# Patient Record
Sex: Male | Born: 1935
Health system: Southern US, Community
[De-identification: ages and names within clinical notes are randomized; demographics above are authoritative.]

## PROBLEM LIST (undated history)

## (undated) DIAGNOSIS — W108XXA Fall (on) (from) other stairs and steps, initial encounter: Secondary | ICD-10-CM

## (undated) DIAGNOSIS — K219 Gastro-esophageal reflux disease without esophagitis: Secondary | ICD-10-CM

## (undated) DIAGNOSIS — I10 Essential (primary) hypertension: Secondary | ICD-10-CM

## (undated) DIAGNOSIS — C4492 Squamous cell carcinoma of skin, unspecified: Secondary | ICD-10-CM

## (undated) DIAGNOSIS — C4491 Basal cell carcinoma of skin, unspecified: Secondary | ICD-10-CM

## (undated) DIAGNOSIS — M1990 Unspecified osteoarthritis, unspecified site: Secondary | ICD-10-CM

## (undated) DIAGNOSIS — C4372 Malignant melanoma of left lower limb, including hip: Secondary | ICD-10-CM

## (undated) HISTORY — DX: Fall (on) (from) other stairs and steps, initial encounter: W10.8XXA

## (undated) HISTORY — DX: Malignant melanoma of left lower limb, including hip: C43.72

## (undated) HISTORY — DX: Essential (primary) hypertension: I10

## (undated) HISTORY — DX: Gastro-esophageal reflux disease without esophagitis: K21.9

## (undated) HISTORY — PX: LEG SURGERY: SHX1003

## (undated) HISTORY — DX: Unspecified osteoarthritis, unspecified site: M19.90

---
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
## 1999-05-29 ENCOUNTER — Encounter: Payer: Self-pay | Admitting: Internal Medicine

## 1999-05-29 ENCOUNTER — Encounter: Admission: RE | Admit: 1999-05-29 | Discharge: 1999-05-29 | Payer: Self-pay | Admitting: Internal Medicine

## 2005-05-20 DIAGNOSIS — C4491 Basal cell carcinoma of skin, unspecified: Secondary | ICD-10-CM

## 2005-05-20 HISTORY — DX: Basal cell carcinoma of skin, unspecified: C44.91

## 2008-09-19 ENCOUNTER — Encounter: Admission: RE | Admit: 2008-09-19 | Discharge: 2008-09-19 | Payer: Self-pay | Admitting: Internal Medicine

## 2009-09-17 ENCOUNTER — Ambulatory Visit: Payer: Self-pay | Admitting: Cardiology

## 2009-09-17 ENCOUNTER — Inpatient Hospital Stay (HOSPITAL_COMMUNITY): Admission: EM | Admit: 2009-09-17 | Discharge: 2009-09-22 | Payer: Self-pay | Admitting: Emergency Medicine

## 2009-09-21 ENCOUNTER — Ambulatory Visit: Payer: Self-pay | Admitting: Surgery

## 2009-09-21 HISTORY — PX: ABDOMINAL AORTIC ANEURYSM REPAIR: SUR1152

## 2009-10-02 ENCOUNTER — Ambulatory Visit: Payer: Self-pay | Admitting: Surgery

## 2009-11-30 ENCOUNTER — Encounter: Admission: RE | Admit: 2009-11-30 | Discharge: 2009-11-30 | Payer: Self-pay | Admitting: Internal Medicine

## 2009-12-25 ENCOUNTER — Ambulatory Visit: Payer: Self-pay | Admitting: Surgery

## 2010-07-16 ENCOUNTER — Ambulatory Visit: Payer: Self-pay | Admitting: Surgery

## 2010-07-16 ENCOUNTER — Encounter
Admission: RE | Admit: 2010-07-16 | Discharge: 2010-07-16 | Payer: Self-pay | Source: Home / Self Care | Attending: Surgery | Admitting: Surgery

## 2010-08-10 ENCOUNTER — Ambulatory Visit
Admission: RE | Admit: 2010-08-10 | Discharge: 2010-08-10 | Payer: Self-pay | Source: Home / Self Care | Attending: Vascular Surgery | Admitting: Vascular Surgery

## 2010-08-26 ENCOUNTER — Encounter: Payer: Self-pay | Admitting: Surgery

## 2010-10-15 ENCOUNTER — Encounter (INDEPENDENT_AMBULATORY_CARE_PROVIDER_SITE_OTHER): Payer: Medicare Other

## 2010-10-15 DIAGNOSIS — I714 Abdominal aortic aneurysm, without rupture, unspecified: Secondary | ICD-10-CM

## 2010-10-15 DIAGNOSIS — Z48812 Encounter for surgical aftercare following surgery on the circulatory system: Secondary | ICD-10-CM

## 2010-10-25 LAB — CBC
HCT: 37.2 % — ABNORMAL LOW (ref 39.0–52.0)
HCT: 40.8 % (ref 39.0–52.0)
HCT: 42.3 % (ref 39.0–52.0)
HCT: 43 % (ref 39.0–52.0)
Hemoglobin: 13.2 g/dL (ref 13.0–17.0)
Hemoglobin: 14.3 g/dL (ref 13.0–17.0)
Hemoglobin: 14.8 g/dL (ref 13.0–17.0)
Hemoglobin: 15 g/dL (ref 13.0–17.0)
MCHC: 35 g/dL (ref 30.0–36.0)
MCHC: 35 g/dL (ref 30.0–36.0)
MCHC: 35.1 g/dL (ref 30.0–36.0)
MCHC: 35.5 g/dL (ref 30.0–36.0)
MCV: 93 fL (ref 78.0–100.0)
MCV: 93 fL (ref 78.0–100.0)
MCV: 93.3 fL (ref 78.0–100.0)
MCV: 93.4 fL (ref 78.0–100.0)
Platelets: 120 K/uL — ABNORMAL LOW (ref 150–400)
Platelets: 128 10*3/uL — ABNORMAL LOW (ref 150–400)
Platelets: 139 K/uL — ABNORMAL LOW (ref 150–400)
Platelets: 150 K/uL (ref 150–400)
RBC: 3.99 MIL/uL — ABNORMAL LOW (ref 4.22–5.81)
RBC: 4.39 MIL/uL (ref 4.22–5.81)
RBC: 4.52 MIL/uL (ref 4.22–5.81)
RBC: 4.61 MIL/uL (ref 4.22–5.81)
RDW: 13.1 % (ref 11.5–15.5)
RDW: 13.2 % (ref 11.5–15.5)
RDW: 13.2 % (ref 11.5–15.5)
RDW: 13.3 % (ref 11.5–15.5)
WBC: 6.4 10*3/uL (ref 4.0–10.5)
WBC: 6.6 10*3/uL (ref 4.0–10.5)
WBC: 7.7 10*3/uL (ref 4.0–10.5)
WBC: 8.4 K/uL (ref 4.0–10.5)

## 2010-10-25 LAB — CROSSMATCH
ABO/RH(D): O POS
Antibody Screen: NEGATIVE

## 2010-10-25 LAB — COMPREHENSIVE METABOLIC PANEL
ALT: 28 U/L (ref 0–53)
AST: 24 U/L (ref 0–37)
Alkaline Phosphatase: 47 U/L (ref 39–117)
CO2: 24 mEq/L (ref 19–32)
Calcium: 8.7 mg/dL (ref 8.4–10.5)
Chloride: 89 mEq/L — ABNORMAL LOW (ref 96–112)
GFR calc Af Amer: >60 mL/min (ref >60)
GFR calc non Af Amer: >60 mL/min (ref >60)
Potassium: 3.3 mEq/L — ABNORMAL LOW (ref 3.5–5.1)
Sodium: 126 mEq/L — ABNORMAL LOW (ref 135–145)

## 2010-10-25 LAB — COMPREHENSIVE METABOLIC PANEL WITH GFR
Albumin: 3.7 g/dL (ref 3.5–5.2)
BUN: 18 mg/dL (ref 6–23)
Creatinine, Ser: 0.78 mg/dL (ref 0.4–1.5)
Glucose, Bld: 135 mg/dL — ABNORMAL HIGH (ref 70–99)
Total Bilirubin: 0.7 mg/dL (ref 0.3–1.2)
Total Protein: 6.6 g/dL (ref 6.0–8.3)

## 2010-10-25 LAB — GLUCOSE, CAPILLARY
Glucose-Capillary: 107 mg/dL — ABNORMAL HIGH (ref 70–99)
Glucose-Capillary: 108 mg/dL — ABNORMAL HIGH (ref 70–99)
Glucose-Capillary: 112 mg/dL — ABNORMAL HIGH (ref 70–99)
Glucose-Capillary: 113 mg/dL — ABNORMAL HIGH (ref 70–99)
Glucose-Capillary: 118 mg/dL — ABNORMAL HIGH (ref 70–99)
Glucose-Capillary: 119 mg/dL — ABNORMAL HIGH (ref 70–99)
Glucose-Capillary: 120 mg/dL — ABNORMAL HIGH (ref 70–99)
Glucose-Capillary: 121 mg/dL — ABNORMAL HIGH (ref 70–99)
Glucose-Capillary: 125 mg/dL — ABNORMAL HIGH (ref 70–99)
Glucose-Capillary: 125 mg/dL — ABNORMAL HIGH (ref 70–99)
Glucose-Capillary: 129 mg/dL — ABNORMAL HIGH (ref 70–99)
Glucose-Capillary: 133 mg/dL — ABNORMAL HIGH (ref 70–99)
Glucose-Capillary: 138 mg/dL — ABNORMAL HIGH (ref 70–99)
Glucose-Capillary: 140 mg/dL — ABNORMAL HIGH (ref 70–99)
Glucose-Capillary: 145 mg/dL — ABNORMAL HIGH (ref 70–99)
Glucose-Capillary: 146 mg/dL — ABNORMAL HIGH (ref 70–99)
Glucose-Capillary: 152 mg/dL — ABNORMAL HIGH (ref 70–99)
Glucose-Capillary: 159 mg/dL — ABNORMAL HIGH (ref 70–99)
Glucose-Capillary: 87 mg/dL (ref 70–99)
Glucose-Capillary: 96 mg/dL (ref 70–99)

## 2010-10-25 LAB — BASIC METABOLIC PANEL
BUN: 16 mg/dL (ref 6–23)
CO2: 22 mEq/L (ref 19–32)
CO2: 25 mEq/L (ref 19–32)
Calcium: 8.2 mg/dL — ABNORMAL LOW (ref 8.4–10.5)
Calcium: 8.5 mg/dL (ref 8.4–10.5)
Chloride: 94 mEq/L — ABNORMAL LOW (ref 96–112)
Chloride: 96 mEq/L (ref 96–112)
GFR calc Af Amer: >60 mL/min (ref >60)
GFR calc Af Amer: >60 mL/min (ref >60)
GFR calc Af Amer: >60 mL/min (ref >60)
GFR calc non Af Amer: >60 mL/min (ref >60)
GFR calc non Af Amer: >60 mL/min (ref >60)
GFR calc non Af Amer: >60 mL/min (ref >60)
Glucose, Bld: 133 mg/dL — ABNORMAL HIGH (ref 70–99)
Potassium: 3.4 mEq/L — ABNORMAL LOW (ref 3.5–5.1)
Sodium: 127 mEq/L — ABNORMAL LOW (ref 135–145)

## 2010-10-25 LAB — POCT I-STAT, CHEM 8
BUN: 18 mg/dL (ref 6–23)
Calcium, Ion: 0.89 mmol/L — ABNORMAL LOW (ref 1.12–1.32)
Chloride: 101 mEq/L (ref 96–112)
Creatinine, Ser: 0.8 mg/dL (ref 0.4–1.5)
Glucose, Bld: 100 mg/dL — ABNORMAL HIGH (ref 70–99)
HCT: 41 % (ref 39.0–52.0)
Hemoglobin: 13.9 g/dL (ref 13.0–17.0)
Potassium: 4.2 meq/L (ref 3.5–5.1)
Sodium: 130 meq/L — ABNORMAL LOW (ref 135–145)
TCO2: 23 mmol/L (ref 0–100)

## 2010-10-25 LAB — DIFFERENTIAL
Basophils Absolute: 0 10*3/uL (ref 0.0–0.1)
Basophils Relative: 0 % (ref 0–1)
Eosinophils Absolute: 0.2 10*3/uL (ref 0.0–0.7)
Eosinophils Relative: 3 % (ref 0–5)
Lymphocytes Relative: 20 % (ref 12–46)
Lymphs Abs: 1.3 10*3/uL (ref 0.7–4.0)
Monocytes Absolute: 0.5 10*3/uL (ref 0.1–1.0)
Monocytes Relative: 8 % (ref 3–12)
Neutro Abs: 4.5 K/uL (ref 1.7–7.7)
Neutrophils Relative %: 70 % (ref 43–77)

## 2010-10-25 LAB — BASIC METABOLIC PANEL WITH GFR
BUN: 12 mg/dL (ref 6–23)
BUN: 13 mg/dL (ref 6–23)
BUN: 14 mg/dL (ref 6–23)
CO2: 27 meq/L (ref 19–32)
CO2: 28 meq/L (ref 19–32)
Calcium: 8.5 mg/dL (ref 8.4–10.5)
Calcium: 9.2 mg/dL (ref 8.4–10.5)
Chloride: 92 meq/L — ABNORMAL LOW (ref 96–112)
Chloride: 97 meq/L (ref 96–112)
Creatinine, Ser: 0.65 mg/dL (ref 0.4–1.5)
Creatinine, Ser: 0.74 mg/dL (ref 0.4–1.5)
Creatinine, Ser: 0.85 mg/dL (ref 0.4–1.5)
Creatinine, Ser: 0.85 mg/dL (ref 0.4–1.5)
GFR calc Af Amer: >60 mL/min (ref >60)
GFR calc non Af Amer: >60 mL/min (ref >60)
Glucose, Bld: 114 mg/dL — ABNORMAL HIGH (ref 70–99)
Glucose, Bld: 129 mg/dL — ABNORMAL HIGH (ref 70–99)
Glucose, Bld: 144 mg/dL — ABNORMAL HIGH (ref 70–99)
Potassium: 3.6 meq/L (ref 3.5–5.1)
Potassium: 3.6 meq/L (ref 3.5–5.1)
Potassium: 3.9 meq/L (ref 3.5–5.1)
Sodium: 126 meq/L — ABNORMAL LOW (ref 135–145)
Sodium: 127 meq/L — ABNORMAL LOW (ref 135–145)
Sodium: 130 meq/L — ABNORMAL LOW (ref 135–145)

## 2010-10-25 LAB — PROTIME-INR
INR: 1.16 (ref 0.00–1.49)
Prothrombin Time: 14.7 s (ref 11.6–15.2)

## 2010-10-25 LAB — TYPE AND SCREEN
ABO/RH(D): O POS
Antibody Screen: NEGATIVE

## 2010-10-25 LAB — ABO/RH: ABO/RH(D): O POS

## 2010-10-25 LAB — APTT: aPTT: 32 s (ref 24–37)

## 2010-10-25 LAB — MRSA PCR SCREENING: MRSA by PCR: NEGATIVE

## 2010-10-30 NOTE — Procedures (Unsigned)
DUPLEX ULTRASOUND OF ABDOMINAL AORTA  INDICATION:  Abdominal aortic aneurysm stent.  HISTORY: Diabetes:  Yes. Cardiac:  No. Hypertension:  Yes. Smoking:  No. Connective Tissue Disorder: Family History:  No. Previous Surgery:  Abdominal aortic aneurysm stent on 09/21/2009.  DUPLEX EXAM:         AP (cm)                   TRANSVERSE (cm) Proximal             2.96 cm                   2.94 cm Mid                  Not visualized            Not visualized Distal               5.3 cm                    5.4 cm Right Iliac          Not visualized            Not visualized Left Iliac           Not visualized            Not visualized  PREVIOUS:  Date:  08/10/2010  AP:  5.3  TRANSVERSE:  5.2  IMPRESSION: 1. Patent distal abdominal aortic aneurysm stent with no evidence of     stenosis or endo leak noted based on limited visualization. 2. No significant change in the abdominal aortic aneurysm sac size     when compared to the previous exam. 3. Unable to adequately visualize the mid abdominal aorta and     bilateral common iliac arteries due to overlying bowel gas patterns     and patient body habitus.  ___________________________________________ V. Charlena Cross, MD  CH/MEDQ  D:  10/16/2010  T:  10/16/2010  Job:  [id number]

## 2010-11-22 DIAGNOSIS — C4492 Squamous cell carcinoma of skin, unspecified: Secondary | ICD-10-CM

## 2010-11-22 HISTORY — DX: Squamous cell carcinoma of skin, unspecified: C44.92

## 2010-12-18 NOTE — Assessment & Plan Note (Signed)
OFFICE VISIT   Danny Molina, Danny Molina  DOB:  [birth year]                                       10/02/2009  [id number]#:[id number]   The patient comes back in today for follow-up.  He is status post  percutaneous endovascular repair of a symptomatic abdominal aortic  aneurysm on September 21, 2009.  In the interval since his discharge, he  went to [facility] for swelling in his left side and left lower  abdomen.  A CT scan was performed which did not reveal any pathology in  this area.  It did show a type 2 endoleak without significant change in  his aneurysm.  He comes back in today for further clarification of these  findings and follow-up.  He says that this bulge in his left flank has  decreased in size.  He said it originally measured half of a [employer]  football.  It is not tender, does not cause him any pain.   PHYSICAL EXAMINATION:  There is a fullness in the left flank.  It is  soft and nontender.  There is a mild amount of ecchymosis in the groin.  There is no hematoma.   On review of his CT scan from [facility], there appears to be a proximal  type 2 endoleak.  There is no CT scan evidence defining the process in  the left flank.   I have reassured him that I do not see anything obviously problematic  with his left flank.  We discussed the significance of a type 2  endoleak.  I will plan on having him come back in 3 months with a CT  scan for follow-up.     Jorge Ny, MD  Electronically Signed   VWB/MEDQ  D:  10/02/2009  T:  10/03/2009  Job:  [id number]   cc:   Erskine Speed, M.D.

## 2010-12-18 NOTE — Assessment & Plan Note (Signed)
OFFICE VISIT   Danny Molina, Danny Molina  DOB:  [birth year]                                       12/25/2009  [id number]#:[id number]   Patient comes back in today for follow-up.  He is status post  percutaneous endovascular repair of a symptomatic abdominal aneurysm on  09/21/2009.  He has been complaining of a left flank bulge.  A CT scan  was performed that did not reveal any etiology for this area, although  it is clearly evident on physical exam when he stands up but disappears  when he lays down.  He has a known type 2 endoleak.  He recently had a  CT scan from [place].  He comes in for review.  His biggest complaints  today are back pain, loss of energy, and a persistent flank bulge.   PHYSICAL EXAMINATION:  Heart rate 65, blood pressure 159/81, respiratory  rate 20.  General:  He is well-appearing in no distress.  Respirations  are nonlabored.  His abdomen is obese but soft.  There is this left  flank bulge.  It disappears with laying prone.  It is more prominent on  sitting up.  I do not feel a fascial defect.  Skin:  No rashes.   DIAGNOSTICS:  I have reviewed his CT scan.  I do not see an endoleak.  His aneurysm has slightly decreased in size from his previous study.  No  clear etiology for the left flank bulge is visualized.   ASSESSMENT/PLAN:  Status post percutaneous endovascular repair of a  symptomatic aneurysm.   PLAN:  Patient's type 2 endoleak, which is present at his 17-month scan  and is not visible on his most recent scan.  I would like to have him  reimaged in 6 months with a CT angiogram.  Overall, I think he is making  good progress.  He does have a lack of energy.  I told him that was  normal at this stage of his recovery.  He does feel like he is getting  more and more strength with each day.   With regards to his flank bulge, I am not sure the etiology of this.  It  could represent a hernia.  However, I do not feel a fascial defect and I  do not see any evidence of that on CT scan.  We discussed referral to  general surgery for further evaluation; however, he would like to see  what Dr. Chilton Si has to say when he sees him in a couple months.  I plan  on seeing him back in 6 months with a CT angiogram.     Jorge Ny, MD  Electronically Signed   VWB/MEDQ  D:  12/25/2009  T:  12/26/2009  Job:  [id number]   cc:   Erskine Speed, M.D.

## 2010-12-18 NOTE — Procedures (Signed)
DUPLEX ULTRASOUND OF ABDOMINAL AORTA   INDICATION:  Follow up abdominal aortic aneurysm repair.  Abdominal  pain, endoleak.   HISTORY:  Diabetes:  Yes.  Cardiac:  No.  Hypertension:  Yes.  Smoking:  No.  Connective Tissue Disorder:  Family History:  No.  Previous Surgery:  Abdominal aortic aneurysm repair, 09/21/2009.   DUPLEX EXAM:         AP (cm)                   TRANSVERSE (cm)  Proximal             2.7 cm                    Not seen due to bowel gas  Mid                  5.3 cm                    5.1 cm  Distal               5.0 cm                    5.2 cm  Right Iliac          Not visualized            Not visualized  Left Iliac           1.2 cm                    Not visualized   PREVIOUS CT:  Date:  AP:  5.3  TRANSVERSE:  5.6   IMPRESSION:  Stable-appearing abdominal aortic endograft repair with  measurements today at 5.3 X 5.1 cm with intraluminal thrombus seen.  There has been no increase in size since previous CT and no endoleak  detected.  Discussed these findings with Dr. Imogene Burn.   ___________________________________________  Fransisco Hertz, MD   OD/MEDQ  D:  08/10/2010  T:  08/10/2010  Job:  [id number]

## 2010-12-18 NOTE — Assessment & Plan Note (Signed)
OFFICE VISIT   Danny Molina, Danny Molina  DOB:  Jan 31, 1935                                       08/10/2010  NWGNF#:[id number]   HISTORY OF PRESENT ILLNESS:  This is a 76 year old gentleman status post  an EVAR that was completed by Dr. Myra Gianotti in February of 2011.  The  patient at this point recently developed some epigastric pain, was seen  by his primary care physician who was concerned with whether or not this  was related to his EVAR so he referred the patient back to [place] for  evaluation today.  The patient notes 4 days ago an episode where his  epigastrium became quite firm and he had abdominal pain.  During this  period he did not have nausea or vomiting and was still passing gas and  having regular bowel movements.  He characterizes this pain as a vague  sensation that does not radiate.  He does not associate it with any  other symptomatology and at this point has improved substantially from  yesterday.  Did not note any fevers or chills.  He had no hematemesis,  no hematochezia, no melena.   PHYSICAL EXAMINATION:  Vital signs:  Today he had a blood pressure  151/78, heart rate 55, respirations were 12.  General:  Well-developed,  well-nourished, no apparent distress.  Pulmonary:  Symmetric expansion,  good air movement.  No rales, rhonchi or wheezing.  Cardiac:  Regular  rate and rhythm.  Normal S1-S2.  No murmurs, rubs, thrills or gallops.  GI:  He has probably a diastasis recti present in the midline.  There is  no tenderness throughout the abdomen.  He has normal bowel sounds.  He  is slightly obese but he is not distended.  I agree that he has some  asymmetry with possible bulge on the left flank.  No obvious palpable  masses.  Vascular:  He has easily palpable bilateral femoral pulses and  pedal pulses.   He had noninvasive vascular imaging completed.  An endograft duplex,  specifically it notes stable appearing sac at 5.3 x 5.1 with thrombus  within  the sac.  There is no endo leak evident.  There was a patent  abdominal endograft.  This appears to be widely patent.   MEDICAL DECISION MAKING:  This is a 76 year old gentleman who presents  status post an EVAR now almost 10 months past.  His symptomatology is  not consistent with any complications from his EVAR placement.  He  recently has had a CT scan done on 07/16/2010.  At that point in  reviewing the CAT scan I do not see any obvious intra-abdominal  pathology to account for his symptomatology.  At this point he has a  nontoxic abdomen with no evidence of a surgical abdomen.  I do not see  any need to rescan him as he recently has had a CTA completed.  The  duplex demonstrates a patent aortic endograft with no evidence of endo  leak at this point.  So unfortunately I am not able to determine exactly  the source of his abdominal pain but this is not related from the  studies I have available to his endograft.     Fransisco Hertz, MD  Electronically Signed   BLC/MEDQ  D:  08/10/2010  T:  08/13/2010  Job:  [id number]

## 2010-12-18 NOTE — Assessment & Plan Note (Signed)
OFFICE VISIT   Danny Molina, Danny Molina  DOB:  [birth year]                                       07/16/2010  CHART#:[id number]   REASON FOR FOLLOWUP:  Status post aneurysm repair.   HISTORY:  This is a 76 year old gentleman who underwent percutaneous  aneurysm repair on 09/21/2009.  His initial scan he had a type 2 leak,  however, this has resolved on subsequent imaging studies.  His biggest  complaint last time was a left flank bulge which was unclear as to the  etiology of this.  Most likely it could have represented a hernia.  He  has had two episodes of pain with this, however, it does not bother him  a lot and he feels like it has actually gotten smaller in size.  He  denies having any other abdominal pain.   PHYSICAL EXAMINATION:  Vital signs:  Heart rate is 53, blood pressure  150/72, O2 sats 97%.  General:  He is well-appearing, in no distress.  HEENT:  Within normal limits.  Abdomen:  Soft, nontender.  There is a  slight bulge in the left flank that is nontender.  His groins are soft.  Extremities:  Warm and well-perfused.   DIAGNOSTIC STUDIES:  CT scan performed today shows a slight decrease in  the size of his abdominal aneurysm, now measuring 5.3 x 6.5, previously  it was 5.7 x 6.9.  There is no endo leak.   ASSESSMENT:  Status post stent graft repair of his abdominal aortic  aneurysm.   PLAN:  The patient will be evaluated in 3 months with an ultrasound and  again 6 months later with an ultrasound.  I will plan on seeing him back  in 9 months.  We discussed referral to general surgery for further  evaluation of his abdominal flank bulge to see if this is atypical  hernia.  He wishes to defer any treatment of this right now as it is not  causing him significant problems.     Jorge Ny, MD  Electronically Signed   VWB/MEDQ  Molina:  07/16/2010  T:  07/17/2010  Job:  [id number]   cc:   Erskine Speed, M.Molina.

## 2011-01-08 ENCOUNTER — Ambulatory Visit (HOSPITAL_COMMUNITY)
Admission: RE | Admit: 2011-01-08 | Discharge: 2011-01-08 | Disposition: A | Discharge status: Home / Self Care | Payer: Medicare Other | Source: Ambulatory Visit | Attending: Internal Medicine | Admitting: Internal Medicine

## 2011-01-08 DIAGNOSIS — I10 Essential (primary) hypertension: Secondary | ICD-10-CM | POA: Insufficient documentation

## 2011-01-08 DIAGNOSIS — I080 Rheumatic disorders of both mitral and aortic valves: Secondary | ICD-10-CM | POA: Insufficient documentation

## 2011-12-09 ENCOUNTER — Other Ambulatory Visit: Payer: Self-pay | Admitting: *Deleted

## 2011-12-09 DIAGNOSIS — I714 Abdominal aortic aneurysm, without rupture, unspecified: Secondary | ICD-10-CM

## 2011-12-09 DIAGNOSIS — Z48812 Encounter for surgical aftercare following surgery on the circulatory system: Secondary | ICD-10-CM

## 2012-01-03 ENCOUNTER — Encounter: Payer: Self-pay | Admitting: Surgery

## 2012-01-10 ENCOUNTER — Encounter: Payer: Self-pay | Admitting: Neurosurgery

## 2012-01-13 ENCOUNTER — Ambulatory Visit (INDEPENDENT_AMBULATORY_CARE_PROVIDER_SITE_OTHER): Payer: Medicare Other | Admitting: Neurosurgery

## 2012-01-13 ENCOUNTER — Encounter (INDEPENDENT_AMBULATORY_CARE_PROVIDER_SITE_OTHER): Payer: Medicare Other | Admitting: *Deleted

## 2012-01-13 ENCOUNTER — Encounter: Payer: Self-pay | Admitting: Neurosurgery

## 2012-01-13 VITALS — BP 151/83 | HR 63 | Resp 16 | Ht 70.5 in | Wt 217.5 lb

## 2012-01-13 DIAGNOSIS — Z48812 Encounter for surgical aftercare following surgery on the circulatory system: Secondary | ICD-10-CM

## 2012-01-13 DIAGNOSIS — I714 Abdominal aortic aneurysm, without rupture, unspecified: Secondary | ICD-10-CM | POA: Insufficient documentation

## 2012-01-13 NOTE — Progress Notes (Signed)
[facility] [place] HISTORY AND PHYSICAL   CC: AAA duplex status post endograft February 2011 Referring Physician: Myra Gianotti  History of Present Illness: 77 year old male patient of Dr. Myra Gianotti that underwent a AAA endograft 09/21/2009. Patient states since that time he has no abdominal or back pain, he denies any new medical diagnoses or any recent surgeries. The patient's done well since his procedure and states he's glad he went to the emergency room when he did due to possible rupture.  Past Medical History  Diagnosis Date  . Diabetes mellitus   . Hypertension   . GERD (gastroesophageal reflux disease)   . Arthritis     ROS: [x]  Positive   [ ]  Denies    General: [ ]  Weight loss, [ ]  Fever, [ ]  chills Neurologic: [ ]  Dizziness, [ ]  Blackouts, [ ]  Seizure [ ]  Stroke, [ ]  "Mini stroke", [ ]  Slurred speech, [ ]  Temporary blindness; [ ]  weakness in arms or legs, [ ]  Hoarseness Cardiac: [ ]  Chest pain/pressure, [ ]  Shortness of breath at rest [ ]  Shortness of breath with exertion, [ ]  Atrial fibrillation or irregular heartbeat Vascular: [ ]  Pain in legs with walking, [ ]  Pain in legs at rest, [ ]  Pain in legs at night,  [ ]  Non-healing ulcer, [ ]  Blood clot in vein/DVT,   Pulmonary: [ ]  Home oxygen, [ ]  Productive cough, [ ]  Coughing up blood, [ ]  Asthma,  [ ]  Wheezing Musculoskeletal:  [ ]  Arthritis, [ ]  Low back pain, [ ]  Joint pain Hematologic: [ ]  Easy Bruising, [ ]  Anemia; [ ]  Hepatitis Gastrointestinal: [ ]  Blood in stool, [ ]  Gastroesophageal Reflux/heartburn, [ ]  Trouble swallowing Urinary: [ ]  chronic Kidney disease, [ ]  on HD - [ ]  MWF or [ ]  TTHS, [ ]  Burning with urination, [ ]  Difficulty urinating Skin: [ ]  Rashes, [ ]  Wounds Psychological: [ ]  Anxiety, [ ]  Depression   Social History History  Substance Use Topics  . Smoking status: Former Smoker    Types: Cigarettes    Quit date: 01/02/1993  . Smokeless tobacco: Not on file  . Alcohol Use: 0.6  oz/week    1 Shots of liquor per week    Family History Family History  Problem Relation Age of Onset  . Heart disease Mother     Heart Disease before age 101  . Hypertension Mother   . Heart attack Mother   . Cancer Father   . Heart disease Father   . Hypertension Sister   . Diabetes Brother   . Heart disease Brother   . Hypertension Brother   . Heart attack Brother     Allergies  Allergen Reactions  . Penicillins Rash    Current Outpatient Prescriptions  Medication Sig Dispense Refill  . aspirin 81 MG tablet Take 81 mg by mouth daily.      . chlorpheniramine (CHLOR-TRIMETON) 4 MG tablet Take 4 mg by mouth 2 (two) times daily as needed.      . doxazosin (CARDURA) 4 MG tablet Take 4 mg by mouth at bedtime. 1/2 tab qd      . fish oil-omega-3 fatty acids 1000 MG capsule Take 1,200 g by mouth daily.      . hydrochlorothiazide (HYDRODIURIL) 25 MG tablet Take 25 mg by mouth daily.      . metFORMIN (GLUCOPHAGE) 500 MG tablet Take 500 mg by mouth 2 (two) times daily with a meal.      .  metoprolol (TOPROL-XL) 200 MG 24 hr tablet Take 200 mg by mouth daily. 1/2 tab qd      . Multiple Vitamin (MULTIVITAMIN) tablet Take 1 tablet by mouth daily.      Marland Kitchen omeprazole (PRILOSEC) 20 MG capsule Take 20 mg by mouth daily.      . valsartan (DIOVAN) 320 MG tablet Take 320 mg by mouth daily.        Physical Examination  Filed Vitals:   01/13/12 0912  BP: 151/83  Pulse: 63  Resp: 16    Body mass index is 30.77 kg/(m^2).  General:  WDWN in NAD Gait: Normal HEENT: WNL Eyes: Pupils equal Pulmonary: normal non-labored breathing , without Rales, rhonchi,  wheezing Cardiac: RRR, without  Murmurs, rubs or gallops; No carotid bruits Abdomen: soft, NT, no masses Skin: no rashes, ulcers noted Vascular Exam/Pulses: Palpable femoral pulses bilaterally, 2+ radial pulses bilaterally, no carotid bruits are heard  Extremities without ischemic changes, no Gangrene , no cellulitis; no open wounds;    Musculoskeletal: no muscle wasting or atrophy  Neurologic: A&O X 3; Appropriate Affect ; SENSATION: normal; MOTOR FUNCTION:  moving all extremities equally. Speech is fluent/normal  Non-Invasive Vascular Imaging: AAA sac size is 4.6 cm, previous size March 2012 was 5.3, there is no endoleak.  ASSESSMENT/PLAN: Asymptomatic AAA duplex was successful endograft that is now 36 months old, the patient return in one year for repeat AAA duplex and be seen in my clinic. His questions were encouraged and answered.  Lauree Chandler ANP  Clinic M.D.: Myra Gianotti

## 2012-01-20 ENCOUNTER — Ambulatory Visit: Payer: Medicare Other | Admitting: Surgery

## 2012-01-20 NOTE — Procedures (Unsigned)
VASCULAR LAB EXAM  INDICATION:  Follow up AAA endograft placed 09/21/2009  HISTORY: Diabetes:  Yes Cardiac:  No Hypertension:  Yes  EXAM:  AAA sac size 4.68 cm AP, 4.6 cm transverse. Previous sac size 10/15/2010 5.3 cm AP, 5.4 cm transverse.  IMPRESSION: 1. The aorta and endograft appear patent where visualized. 2. No significant change in size of aneurysm sac surrounding the     endograft. 3. No evidence of endoleak was detected. 4. Limited visualization due to overlying bowel gas.  ___________________________________________ V. Charlena Cross, MD  EM/MEDQ  D:  01/14/2012  T:  01/14/2012  Job:  [id number]

## 2012-06-22 DIAGNOSIS — E119 Type 2 diabetes mellitus without complications: Secondary | ICD-10-CM | POA: Diagnosis not present

## 2013-01-05 ENCOUNTER — Other Ambulatory Visit: Payer: Self-pay | Admitting: *Deleted

## 2013-01-05 DIAGNOSIS — I714 Abdominal aortic aneurysm, without rupture, unspecified: Secondary | ICD-10-CM

## 2013-01-05 DIAGNOSIS — Z48812 Encounter for surgical aftercare following surgery on the circulatory system: Secondary | ICD-10-CM

## 2013-01-12 ENCOUNTER — Ambulatory Visit: Payer: Medicare Other | Admitting: Neurosurgery

## 2013-01-12 ENCOUNTER — Encounter (INDEPENDENT_AMBULATORY_CARE_PROVIDER_SITE_OTHER): Payer: Medicare Other | Admitting: *Deleted

## 2013-01-12 DIAGNOSIS — Z48812 Encounter for surgical aftercare following surgery on the circulatory system: Secondary | ICD-10-CM

## 2013-01-12 DIAGNOSIS — I714 Abdominal aortic aneurysm, without rupture, unspecified: Secondary | ICD-10-CM

## 2013-01-18 ENCOUNTER — Other Ambulatory Visit: Payer: Self-pay | Admitting: *Deleted

## 2013-01-18 DIAGNOSIS — Z48812 Encounter for surgical aftercare following surgery on the circulatory system: Secondary | ICD-10-CM

## 2013-01-18 DIAGNOSIS — I714 Abdominal aortic aneurysm, without rupture, unspecified: Secondary | ICD-10-CM

## 2013-01-20 ENCOUNTER — Encounter: Payer: Self-pay | Admitting: Surgery

## 2013-07-05 DIAGNOSIS — W108XXA Fall (on) (from) other stairs and steps, initial encounter: Secondary | ICD-10-CM

## 2013-07-05 HISTORY — DX: Fall (on) (from) other stairs and steps, initial encounter: W10.8XXA

## 2014-01-21 ENCOUNTER — Encounter: Payer: Self-pay | Admitting: Family

## 2014-01-24 ENCOUNTER — Ambulatory Visit: Payer: Medicare Other | Admitting: Surgery

## 2014-01-24 ENCOUNTER — Other Ambulatory Visit: Payer: Self-pay | Admitting: Surgery

## 2014-01-24 ENCOUNTER — Ambulatory Visit (INDEPENDENT_AMBULATORY_CARE_PROVIDER_SITE_OTHER): Payer: Medicare Other | Admitting: Family

## 2014-01-24 ENCOUNTER — Ambulatory Visit (HOSPITAL_COMMUNITY)
Admission: RE | Admit: 2014-01-24 | Discharge: 2014-01-24 | Disposition: A | Discharge status: Home / Self Care | Payer: Medicare Other | Source: Ambulatory Visit | Attending: Family | Admitting: Family

## 2014-01-24 ENCOUNTER — Encounter: Payer: Self-pay | Admitting: Family

## 2014-01-24 ENCOUNTER — Encounter (HOSPITAL_COMMUNITY): Payer: Medicare Other

## 2014-01-24 VITALS — BP 145/83 | HR 55 | Resp 14 | Ht 70.5 in | Wt 202.0 lb

## 2014-01-24 DIAGNOSIS — I714 Abdominal aortic aneurysm, without rupture, unspecified: Secondary | ICD-10-CM

## 2014-01-24 DIAGNOSIS — Z01810 Encounter for preprocedural cardiovascular examination: Secondary | ICD-10-CM

## 2014-01-24 DIAGNOSIS — Z48812 Encounter for surgical aftercare following surgery on the circulatory system: Secondary | ICD-10-CM

## 2014-01-24 LAB — CREATININE, SERUM: Creat: 0.9 mg/dL (ref 0.50–1.35)

## 2014-01-24 LAB — BUN: BUN: 14 mg/dL (ref 6–23)

## 2014-01-24 NOTE — Patient Instructions (Signed)
Abdominal Aortic Aneurysm An aneurysm is a weakened or damaged part of an artery wall that bulges from the normal force of blood pumping through the body. An abdominal aortic aneurysm is an aneurysm that occurs in the lower part of the aorta, the main artery of the body.  The major concern with an abdominal aortic aneurysm is that it can enlarge and burst (rupture) or blood can flow between the layers of the wall of the aorta through a tear (aorticdissection). Both of these conditions can cause bleeding inside the body and can be life threatening unless diagnosed and treated promptly. CAUSES  The exact cause of an abdominal aortic aneurysm is unknown. Some contributing factors are:   A hardening of the arteries caused by the buildup of fat and other substances in the lining of a blood vessel (arteriosclerosis).  Inflammation of the walls of an artery (arteritis).   Connective tissue diseases, such as Marfan syndrome.   Abdominal trauma.   An infection, such as syphilis or staphylococcus, in the wall of the aorta (infectious aortitis) caused by bacteria. RISK FACTORS  Risk factors that contribute to an abdominal aortic aneurysm may include:  Age older than 60 years.   High blood pressure (hypertension).  Male gender.  Ethnicity (white race).  Obesity.  Family history of aneurysm (first degree relatives only).  Tobacco use. PREVENTION  The following healthy lifestyle habits may help decrease your risk of abdominal aortic aneurysm:  Quitting smoking. Smoking can raise your blood pressure and cause arteriosclerosis.  Limiting or avoiding alcohol.  Keeping your blood pressure, blood sugar level, and cholesterol levels within normal limits.  Decreasing your salt intake. In somepeople, too much salt can raise blood pressure and increase your risk of abdominal aortic aneurysm.  Eating a diet low in saturated fats and cholesterol.  Increasing your fiber intake by including  whole grains, vegetables, and fruits in your diet. Eating these foods may help lower blood pressure.  Maintaining a healthy weight.  Staying physically active and exercising regularly. SYMPTOMS  The symptoms of abdominal aortic aneurysm may vary depending on the size and rate of growth of the aneurysm.Most grow slowly and do not have any symptoms. When symptoms do occur, they may include:  Pain (abdomen, side, lower back, or groin). The pain may vary in intensity. A sudden onset of severe pain may indicate that the aneurysm has ruptured.  Feeling full after eating only small amounts of food.  Nausea or vomiting or both.  Feeling a pulsating lump in the abdomen.  Feeling faint or passing out. DIAGNOSIS  Since most unruptured abdominal aortic aneurysms have no symptoms, they are often discovered during diagnostic exams for other conditions. An aneurysm may be found during the following procedures:  Ultrasonography (A one-time screening for abdominal aortic aneurysm by ultrasonography is also recommended for all men aged 65-75 years who have ever smoked).  X-ray exams.  A computed tomography (CT).  Magnetic resonance imaging (MRI).  Angiography or arteriography. TREATMENT  Treatment of an abdominal aortic aneurysm depends on the size of your aneurysm, your age, and risk factors for rupture. Medication to control blood pressure and pain may be used to manage aneurysms smaller than 6 cm. Regular monitoring for enlargement may be recommended by your caregiver if:  The aneurysm is 3-4 cm in size (an annual ultrasonography may be recommended).  The aneurysm is 4-4.5 cm in size (an ultrasonography every 6 months may be recommended).  The aneurysm is larger than 4.5 cm in   size (your caregiver may ask that you be examined by a vascular surgeon). If your aneurysm is larger than 6 cm, surgical repair may be recommended. There are two main methods for repair of an aneurysm:   Endovascular  repair (a minimally invasive surgery). This is done most often.  Open repair. This method is used if an endovascular repair is not possible. Document Released: 05/01/2005 Document Revised: 11/16/2012 Document Reviewed: 08/21/2012 [employer] Patient Information 2015 [employer]. This information is not intended to replace advice given to you by your health care provider. Make sure you discuss any questions you have with your health care provider.  

## 2014-01-24 NOTE — Progress Notes (Signed)
[facility] [place]  Established [facility]  History of Present Illness  Danny Molina is a 79 y.o. (Apr 04, 1935) male patient of Dr. Trula Slade who is s/p AAA endograft 09/21/2009. He presents today for follow up. The patient has chronic low back pain with left radiculopathy, denies any new back pain, denies abdominal pain. His AAA was discovered as part of his back pain work up. He states his cholesterol is good and does not take a statin. He does take a daily ASA and a beta blocker. Pt denies any history of stroke or TIA. He does not walk much due to cracking and bleeding on his soles, sees a dermatologist, and due to radiculopathy.   Pt Diabetic: Yes, states his last A1C was 5.6 Pt smoker: former smoker, quit in the 1990's   Past Medical History  Diagnosis Date  . Diabetes mellitus   . Hypertension   . GERD (gastroesophageal reflux disease)   . Arthritis    Past Surgical History  Procedure Laterality Date  . Abdominal aortic aneurysm repair  09/21/2009   Social History History  Substance Use Topics  . Smoking status: Former Smoker    Types: Cigarettes    Quit date: 01/02/1993  . Smokeless tobacco: Not on file  . Alcohol Use: 0.6 oz/week    1 Shots of liquor per week   Family History Family History  Problem Relation Age of Onset  . Heart disease Mother     Heart Disease before age 39  . Hypertension Mother   . Heart attack Mother   . Cancer Father   . Heart disease Father   . Hypertension Sister   . Diabetes Brother   . Heart disease Brother   . Hypertension Brother   . Heart attack Brother    Current Outpatient Prescriptions on File Prior to Visit  Medication Sig Dispense Refill  . aspirin 81 MG tablet Take 81 mg by mouth daily.      . chlorpheniramine (CHLOR-TRIMETON) 4 MG tablet Take 4 mg by mouth 2 (two) times daily as needed.      . doxazosin (CARDURA) 4 MG tablet Take 4 mg by mouth at bedtime. 1/2 tab qd      . fish oil-omega-3 fatty  acids 1000 MG capsule Take 1,200 g by mouth daily.      . hydrochlorothiazide (HYDRODIURIL) 25 MG tablet Take 25 mg by mouth daily.      . metFORMIN (GLUCOPHAGE) 500 MG tablet Take 500 mg by mouth 2 (two) times daily with a meal.      . metoprolol (TOPROL-XL) 200 MG 24 hr tablet Take 200 mg by mouth daily. 1/2 tab qd      . Multiple Vitamin (MULTIVITAMIN) tablet Take 1 tablet by mouth daily.      Marland Kitchen omeprazole (PRILOSEC) 20 MG capsule Take 20 mg by mouth daily.      . valsartan (DIOVAN) 320 MG tablet Take 320 mg by mouth daily.       No current facility-administered medications on file prior to visit.   Allergies  Allergen Reactions  . Penicillins Rash     ROS: See HPI for pertinent positives and negatives.  Physical Examination  Filed Vitals:   01/24/14 0942  BP: 145/83  Pulse: 55  Resp: 14  Height: 5' 10.5" (1.791 m)  Weight: 202 lb (91.627 kg)  SpO2: 99%   Body mass index is 28.56 kg/(m^2).  General: A&O x 3, WD.  Pulmonary: Sym exp, good air  movt, CTAB, no rales, rhonchi, or wheezing.   Cardiac: RRR, Nl S1, S2, no Murmurs detected.  Vascular: Vessel Right Left  Radial 2+Palpable 2+Palpable  Carotid  without bruit  without bruit  Aorta Not palpable N/A  Femoral 3+Palpable 3+Palpable  Popliteal Not palpable Not palpable  PT 3++Palpable 3+Palpable  DP 2+Palpable 2+Palpable   Gastrointestinal: soft, NTND, -G/R, - HSM, - masses, - CVAT B, ventral hernia noted with use of abdominal muscles.  Musculoskeletal: M/S 5/5 in UE's, 4/5 in LE's, Extremities without ischemic changes.  Neurologic: Pain and light touch intact in extremities, Motor exam as listed above  Non-Invasive Vascular Imaging  [facility]AR Duplex (Date: 01/24/2014) ABDOMINAL AORTA DUPLEX EVALUATION - POST ENDOVASCULAR REPAIR    INDICATION: Follow up endograft    PREVIOUS INTERVENTION(S): Abdominal aorta endograft placed 09/21/2009.    DUPLEX EXAM: Endograft duplex     DIAMETER AP (cm) DIAMETER  TRANSVERSE (cm) VELOCITIES (cm/sec)  Aorta 5.3 5.6 46  Right Common Iliac - - 68  Left Common Iliac - - 58    Comparison Study       Date DIAMETER AP (cm) DIAMETER TRANSVERSE (cm)  01/12/13 4.6 4.6     ADDITIONAL FINDINGS: Gall stone visualized    IMPRESSION: Todays  sac size  measures 5.3 x 5.6 cms, which in comparison to all studied performed since the procedure represents no significant  change.     CTA Abd/Pelvis Duplex (Date: 07/16/10) 1. Stable AAA stent placement with no endovascular leak.  2. Slight decrease in size of abdominal aortic aneurysm now  measuring 53 x 65 mm compared to 57 x 69 mm previously.   Medical Decision Making  Danny Molina is a 79 y.o. male who presents s/p EVAR.  Pt is asymptomatic with increase in size of the AAA.  I discussed with the patient the importance of surveillance of the endograft.  The next CTA will be scheduled for ASAP.  The patient will follow up with Dr. Trula Slade in 1-2 weeks, as soon after the CTA abdomen/pelvis as possible.  I emphasized the importance of maximal medical management including strict control of blood pressure, blood glucose, and lipid levels, antiplatelet agents, obtaining regular exercise, and cessation of smoking.   The patient was given information about AAA including signs, symptoms, treatment, and how to minimize the risk of enlargement and rupture of aneurysms.    Thank you for allowing [place] to participate in this patient's care.  Clemon Chambers, RN, MSN, FNP-C [facility] Office: [facility] Physician: Trula Slade  01/24/2014, 9:20 AM

## 2014-01-25 ENCOUNTER — Ambulatory Visit
Admission: RE | Admit: 2014-01-25 | Discharge: 2014-01-25 | Disposition: A | Discharge status: Home / Self Care | Payer: Medicare Other | Source: Ambulatory Visit | Attending: Family | Admitting: Family

## 2014-01-25 DIAGNOSIS — I714 Abdominal aortic aneurysm, without rupture, unspecified: Secondary | ICD-10-CM

## 2014-01-25 DIAGNOSIS — Z01810 Encounter for preprocedural cardiovascular examination: Secondary | ICD-10-CM

## 2014-01-25 MED ORDER — IOHEXOL 350 MG/ML SOLN
80.0000 mL | Freq: Once | INTRAVENOUS | Status: AC | PRN
  Administered 2014-01-25: 80 mL via INTRAVENOUS

## 2014-01-28 ENCOUNTER — Encounter: Payer: Self-pay | Admitting: Surgery

## 2014-01-31 ENCOUNTER — Ambulatory Visit (INDEPENDENT_AMBULATORY_CARE_PROVIDER_SITE_OTHER): Payer: Medicare Other | Admitting: Surgery

## 2014-01-31 ENCOUNTER — Encounter: Payer: Self-pay | Admitting: Surgery

## 2014-01-31 VITALS — BP 155/80 | HR 69 | Ht 70.5 in | Wt 202.5 lb

## 2014-01-31 DIAGNOSIS — I714 Abdominal aortic aneurysm, without rupture, unspecified: Secondary | ICD-10-CM

## 2014-01-31 NOTE — Addendum Note (Signed)
Addended by: Mena Goes on: 01/31/2014 04:56 PM   Modules accepted: Orders

## 2014-01-31 NOTE — Progress Notes (Signed)
Patient name: Danny Molina MRN: [id number] DOB: Jan 21, 1935 Sex: male     Chief Complaint  Patient presents with  . Re-evaluation    1 wk f/u s/p CTA abd/pelvis    HISTORY OF PRESENT ILLNESS: The patient comes back today for further evaluation of his abdominal aortic aneurysm.  He is status post endovascular aneurysm repair on 09/21/2009.  His most recent ultrasound showed a significant increase in the size of his aneurysm.  He reports no symptoms.  Past Medical History  Diagnosis Date  . Diabetes mellitus   . Hypertension   . GERD (gastroesophageal reflux disease)   . Arthritis   . Fall down stairs Dec. 2014    At a [facility] home    Past Surgical History  Procedure Laterality Date  . Abdominal aortic aneurysm repair  09/21/2009    History   Social History  . Marital Status: Married    Spouse Name: N/A    Number of Children: N/A  . Years of Education: N/A   Occupational History  . Not on file.   Social History Main Topics  . Smoking status: Former Smoker    Types: Cigarettes    Quit date: 01/02/1993  . Smokeless tobacco: Never Used  . Alcohol Use: 0.6 oz/week    1 Shots of liquor per week  . Drug Use: No  . Sexual Activity: Not on file   Other Topics Concern  . Not on file   Social History Narrative  . No narrative on file    Family History  Problem Relation Age of Onset  . Heart disease Mother     Heart Disease before age 4  . Hypertension Mother   . Heart attack Mother   . Cancer Father   . Heart disease Father   . Heart attack Father   . Hypertension Sister   . Diabetes Sister   . Diabetes Brother   . Heart disease Brother   . Hypertension Brother   . Heart attack Brother   . Diabetes Brother     Allergies as of 01/31/2014 - Review Complete 01/31/2014  Allergen Reaction Noted  . Penicillins Anaphylaxis and Rash 01/03/2012    Current Outpatient Prescriptions on File Prior to Visit  Medication Sig Dispense Refill  . aspirin 81  MG tablet Take 81 mg by mouth daily.      . chlorpheniramine (CHLOR-TRIMETON) 4 MG tablet Take 4 mg by mouth 2 (two) times daily as needed.      . doxazosin (CARDURA) 4 MG tablet Take 4 mg by mouth at bedtime. 1/2 tab qd      . fish oil-omega-3 fatty acids 1000 MG capsule Take 1,200 g by mouth daily.      . hydrochlorothiazide (HYDRODIURIL) 25 MG tablet Take 25 mg by mouth daily.      . metFORMIN (GLUCOPHAGE) 500 MG tablet Take 500 mg by mouth 2 (two) times daily with a meal.      . metoprolol (TOPROL-XL) 200 MG 24 hr tablet Take 200 mg by mouth daily. 1/2 tab qd      . Multiple Vitamin (MULTIVITAMIN) tablet Take 1 tablet by mouth daily.      Marland Kitchen omeprazole (PRILOSEC) 20 MG capsule Take 20 mg by mouth daily.      . valsartan (DIOVAN) 320 MG tablet Take 320 mg by mouth daily.       No current facility-administered medications on file prior to visit.     REVIEW OF SYSTEMS:  Please see last week's visit, no changes  PHYSICAL EXAMINATION:   Vital signs are BP 155/80  Pulse 69  Ht 5' 10.5" (1.791 m)  Wt 202 lb 8 oz (91.853 kg)  BMI 28.64 kg/m2  SpO2 99% General: The patient appears their stated age. HEENT:  No gross abnormalities Pulmonary:  Non labored breathing Abdomen: Soft and non-tender Musculoskeletal: There are no major deformities. Neurologic: No focal weakness or paresthesias are detected, Skin: There are no ulcer or rashes noted. Psychiatric: The patient has normal affect. Cardiovascular: There is a regular rate and rhythm without significant murmur appreciated.   Diagnostic Studies CT angiogram was ordered that I have personally reviewed.  This shows the maximum aneurysm diameter to be 5.9 x 5.1. CT angiogram aortic diameter measurements: 11/2009:5.7 x 6.9 07/2010: 5.3 x 6.5 6 2015:5.9 x 5.1  Ultrasound aortic diameter measurements: 01/2012:4.6 01/2013:4.7 01/2014:5.3 x 5.6  Assessment: Status post endovascular aneurysm repair Plan: I reviewed the above measurements  by ultrasound and CT scan with the patient.  The one abnormality is the most recent ultrasound.  After reviewing his CT scan, there is significant angulation within the aneurysm.  I wonder if this affected the ultrasound measurements.  I did not see any complicating features regarding his repair.  Specifically there was no evidence of a type I or type III endoleak.  I have recommended that the patient get a repeat CT scan in 6 months  V. Leia Alf, M.D. [facility] [place] Office: [phone] Pager:  [phone]

## 2014-02-28 ENCOUNTER — Ambulatory Visit: Payer: Medicare Other | Admitting: Surgery

## 2014-06-22 DIAGNOSIS — E109 Type 1 diabetes mellitus without complications: Secondary | ICD-10-CM | POA: Diagnosis not present

## 2014-08-02 ENCOUNTER — Encounter: Payer: Self-pay | Admitting: Surgery

## 2014-08-03 ENCOUNTER — Other Ambulatory Visit: Payer: Self-pay | Admitting: Surgery

## 2014-08-03 LAB — BUN: BUN: 13 mg/dL (ref 6–23)

## 2014-08-03 LAB — CREATININE, SERUM: Creat: 0.82 mg/dL (ref 0.50–1.35)

## 2014-08-08 ENCOUNTER — Ambulatory Visit (INDEPENDENT_AMBULATORY_CARE_PROVIDER_SITE_OTHER): Payer: Medicare Other | Admitting: Surgery

## 2014-08-08 ENCOUNTER — Encounter: Payer: Self-pay | Admitting: Surgery

## 2014-08-08 ENCOUNTER — Ambulatory Visit
Admission: RE | Admit: 2014-08-08 | Discharge: 2014-08-08 | Disposition: A | Discharge status: Home / Self Care | Payer: Medicare Other | Source: Ambulatory Visit | Attending: Surgery | Admitting: Surgery

## 2014-08-08 VITALS — BP 164/82 | HR 62 | Temp 98.2°F | Resp 14 | Ht 70.5 in | Wt 211.0 lb

## 2014-08-08 DIAGNOSIS — I714 Abdominal aortic aneurysm, without rupture, unspecified: Secondary | ICD-10-CM

## 2014-08-08 DIAGNOSIS — Z48812 Encounter for surgical aftercare following surgery on the circulatory system: Secondary | ICD-10-CM

## 2014-08-08 MED ORDER — IOHEXOL 350 MG/ML SOLN
75.0000 mL | Freq: Once | INTRAVENOUS | Status: AC | PRN
  Administered 2014-08-08: 75 mL via INTRAVENOUS

## 2014-08-08 NOTE — Progress Notes (Signed)
Patient name: Danny Molina MRN: [id number] DOB: 1935/04/16 Sex: male     Chief Complaint  Patient presents with  . AAA    6 month follow up with CTA ,  EVAR in 2011,   [facility] [place]    HISTORY OF PRESENT ILLNESS: The patient comes back today for further evaluation of his abdominal aortic aneurysm. He is status post endovascular aneurysm repair on 09/21/2009. His surveillance ultrasound showed a significant increase in the size of his aneurysm.A CT scan was done which showed a 5.9 x 5.1 aneurysm which was completely excluded without evidence of endoleak.  He comes back for a 6 month follow-up.  He has had no complaints.  Past Medical History  Diagnosis Date  . Diabetes mellitus   . Hypertension   . GERD (gastroesophageal reflux disease)   . Arthritis   . Fall down stairs Dec. 2014    At a friends home    Past Surgical History  Procedure Laterality Date  . Abdominal aortic aneurysm repair  09/21/2009    History   Social History  . Marital Status: Married    Spouse Name: N/A    Number of Children: N/A  . Years of Education: N/A   Occupational History  . Not on file.   Social History Main Topics  . Smoking status: Former Smoker    Types: Cigarettes    Quit date: 01/02/1993  . Smokeless tobacco: Never Used  . Alcohol Use: 0.6 oz/week    1 Shots of liquor per week  . Drug Use: No  . Sexual Activity: Not on file   Other Topics Concern  . Not on file   Social History Narrative    Family History  Problem Relation Age of Onset  . Heart disease Mother     Heart Disease before age 20  . Hypertension Mother   . Heart attack Mother   . Cancer Father   . Heart disease Father   . Heart attack Father   . Hypertension Sister   . Diabetes Sister   . Diabetes Brother   . Heart disease Brother   . Hypertension Brother   . Heart attack Brother   . Diabetes Brother     Allergies as of 08/08/2014 - Review Complete  08/08/2014  Allergen Reaction Noted  . Penicillins Anaphylaxis and Rash 01/03/2012    Current Outpatient Prescriptions on File Prior to Visit  Medication Sig Dispense Refill  . aspirin 81 MG tablet Take 81 mg by mouth daily.    . chlorpheniramine (CHLOR-TRIMETON) 4 MG tablet Take 4 mg by mouth 2 (two) times daily as needed.    . doxazosin (CARDURA) 4 MG tablet Take 4 mg by mouth at bedtime. 1/2 tab qd    . fish oil-omega-3 fatty acids 1000 MG capsule Take 1,200 g by mouth daily.    . hydrochlorothiazide (HYDRODIURIL) 25 MG tablet Take 25 mg by mouth daily.    . metFORMIN (GLUCOPHAGE) 500 MG tablet Take 500 mg by mouth 2 (two) times daily with a meal.    . metoprolol (TOPROL-XL) 200 MG 24 hr tablet Take 200 mg by mouth daily. 1/2 tab qd    . Multiple Vitamin (MULTIVITAMIN) tablet Take 1 tablet by mouth daily.    Marland Kitchen omeprazole (PRILOSEC) 20 MG capsule Take 20 mg by mouth daily.    . valsartan (DIOVAN) 320 MG tablet Take 320 mg by mouth daily.     No  current facility-administered medications on file prior to visit.     REVIEW OF SYSTEMS: Cardiovascular: No chest pain, chest pressure, palpitations, orthopnea, or dyspnea on exertion. No claudication or rest pain,  No history of DVT or phlebitis. Pulmonary: No productive cough, asthma or wheezing. Neurologic: No weakness, paresthesias, aphasia, or amaurosis. No dizziness. Hematologic: No bleeding problems or clotting disorders. Musculoskeletal: No joint pain or joint swelling. Gastrointestinal: No blood in stool or hematemesis Genitourinary: No dysuria or hematuria. Psychiatric:: No history of major depression. Integumentary: No rashes or ulcers. Constitutional: No fever or chills.  PHYSICAL EXAMINATION:   Vital signs are BP 164/82 mmHg  Pulse 62  Temp(Src) 98.2 F (36.8 C) (Oral)  Resp 14  Ht 5' 10.5" (1.791 m)  Wt 211 lb (95.709 kg)  BMI 29.84 kg/m2  SpO2 96% General: The patient appears their stated age. HEENT:  No gross  abnormalities Pulmonary:  Non labored breathing Abdomen: Soft and non-tender.  No pulsatile mass Musculoskeletal: There are no major deformities. Neurologic: No focal weakness or paresthesias are detected, Skin: There are no ulcer or rashes noted. Psychiatric: The patient has normal affect. Cardiovascular: There is a regular rate and rhythm without significant murmur appreciated.   Diagnostic Studies CT angiogram was ordered that I have personally reviewed today. There has been no significant interval change of his aneurysm.  It measures 6.0 x 5.1.  There is no evidence of endoleak CT angiogram aortic diameter measurements: 11/2009:5.7 x 6.9 07/2010: 5.3 x 6.5 01/2014:5.9 x 5.1 08/2014:  6.0 x 5.1  Ultrasound aortic diameter measurements: 01/2012:4.6 01/2013:4.7 01/2014:5.3 x 5.6  Assessment: Status post endovascular aneurysm repair Plan: The patient's aneurysm sac has remained stable for the past 6 months.  I suspect the ultrasound measurements from 2013 and 2014 are not accurate.  Regardless, this patient's aneurysm sac has remained stable over the past 6 months.  I will have him back in one year for repeat CT scan  V. Leia Alf, M.D. [facility] [place] Office: [phone] Pager:  [phone]

## 2014-08-08 NOTE — Addendum Note (Signed)
Addended by: Mena Goes on: 08/08/2014 04:49 PM   Modules accepted: Orders

## 2015-08-21 ENCOUNTER — Ambulatory Visit: Payer: Medicare Other | Admitting: Surgery

## 2015-08-24 ENCOUNTER — Other Ambulatory Visit: Payer: Self-pay | Admitting: Surgery

## 2015-08-24 LAB — CREATININE, SERUM: Creat: 0.81 mg/dL (ref 0.70–1.18)

## 2015-08-24 LAB — BUN: BUN: 21 mg/dL (ref 7–25)

## 2015-08-25 ENCOUNTER — Encounter: Payer: Self-pay | Admitting: Family

## 2015-08-29 ENCOUNTER — Ambulatory Visit
Admission: RE | Admit: 2015-08-29 | Discharge: 2015-08-29 | Disposition: A | Discharge status: Home / Self Care | Payer: Medicare Other | Source: Ambulatory Visit | Attending: Surgery | Admitting: Surgery

## 2015-08-29 DIAGNOSIS — Z48812 Encounter for surgical aftercare following surgery on the circulatory system: Secondary | ICD-10-CM

## 2015-08-29 DIAGNOSIS — I714 Abdominal aortic aneurysm, without rupture, unspecified: Secondary | ICD-10-CM

## 2015-08-29 MED ORDER — IOPAMIDOL (ISOVUE-370) INJECTION 76%
75.0000 mL | Freq: Once | INTRAVENOUS | Status: AC | PRN
  Administered 2015-08-29: 75 mL via INTRAVENOUS

## 2015-09-04 ENCOUNTER — Ambulatory Visit (INDEPENDENT_AMBULATORY_CARE_PROVIDER_SITE_OTHER): Payer: Medicare Other | Admitting: Surgery

## 2015-09-04 ENCOUNTER — Encounter: Payer: Self-pay | Admitting: Surgery

## 2015-09-04 VITALS — BP 132/72 | HR 57 | Temp 97.1°F | Resp 14 | Ht 70.0 in | Wt 193.0 lb

## 2015-09-04 DIAGNOSIS — I6529 Occlusion and stenosis of unspecified carotid artery: Secondary | ICD-10-CM

## 2015-09-04 DIAGNOSIS — Z48812 Encounter for surgical aftercare following surgery on the circulatory system: Secondary | ICD-10-CM

## 2015-09-04 DIAGNOSIS — I714 Abdominal aortic aneurysm, without rupture, unspecified: Secondary | ICD-10-CM

## 2015-09-04 NOTE — Addendum Note (Signed)
Addended by: Reola Calkins on: 09/04/2015 04:13 PM   Modules accepted: Orders

## 2015-09-04 NOTE — Progress Notes (Signed)
Patient name: Danny Molina MRN: [id number] DOB: 03/14/1935 Sex: male     Chief Complaint  Patient presents with  . AAA    1 year follow up;  had CTA last week .  Had AAA repair in 2011    HISTORY OF PRESENT ILLNESS:  The patient comes back today for further evaluation of his abdominal aortic aneurysm. He is status post urgent endovascular aneurysm repair on 09/21/2009, for a symptomatic aneurysm.  His surveillance ultrasound showed a significant increase in the size of his aneurysm.A CT scan was done which showed a 5.9 x 5.1 aneurysm which was completely excluded without evidence of endoleak.   He is back today for a CT scan follow up/.  He is complaining of right flank pain after lifting a heavy water jug to the truck  Past Medical History  Diagnosis Date  . Diabetes mellitus   . Hypertension   . GERD (gastroesophageal reflux disease)   . Arthritis   . Fall down stairs Dec. 2014    At a friends home    Past Surgical History  Procedure Laterality Date  . Abdominal aortic aneurysm repair  09/21/2009    Social History   Social History  . Marital Status: Married    Spouse Name: N/A  . Number of Children: N/A  . Years of Education: N/A   Occupational History  . Not on file.   Social History Main Topics  . Smoking status: Former Smoker    Types: Cigarettes    Quit date: 01/02/1993  . Smokeless tobacco: Never Used  . Alcohol Use: 0.6 oz/week    1 Shots of liquor per week  . Drug Use: No  . Sexual Activity: Not on file   Other Topics Concern  . Not on file   Social History Narrative    Family History  Problem Relation Age of Onset  . Heart disease Mother     Heart Disease before age 21  . Hypertension Mother   . Heart attack Mother   . Cancer Father   . Heart disease Father   . Heart attack Father   . Hypertension Sister   . Diabetes Sister   . Diabetes Brother   . Heart disease Brother   . Hypertension Brother   . Heart attack Brother     . Diabetes Brother     Allergies as of 09/04/2015 - Review Complete 09/04/2015  Allergen Reaction Noted  . Penicillins Anaphylaxis and Rash 01/03/2012    Current Outpatient Prescriptions on File Prior to Visit  Medication Sig Dispense Refill  . aspirin 81 MG tablet Take 81 mg by mouth daily.    . chlorpheniramine (CHLOR-TRIMETON) 4 MG tablet Take 4 mg by mouth 2 (two) times daily as needed.    . doxazosin (CARDURA) 4 MG tablet Take 4 mg by mouth at bedtime. 1/2 tab qd    . fish oil-omega-3 fatty acids 1000 MG capsule Take 1,200 g by mouth daily.    Marland Kitchen gabapentin (NEURONTIN) 300 MG capsule Take 300 mg by mouth at bedtime.   12  . hydrochlorothiazide (HYDRODIURIL) 25 MG tablet Take 25 mg by mouth daily.    . metFORMIN (GLUCOPHAGE) 500 MG tablet Take 500 mg by mouth 2 (two) times daily with a meal.    . metoprolol (TOPROL-XL) 200 MG 24 hr tablet Take 200 mg by mouth daily. 1/2 tab qd    . Multiple Vitamin (MULTIVITAMIN) tablet Take 1 tablet by mouth daily.    Marland Kitchen  omeprazole (PRILOSEC) 20 MG capsule Take 20 mg by mouth daily.    . valsartan (DIOVAN) 320 MG tablet Take 320 mg by mouth daily.     No current facility-administered medications on file prior to visit.     REVIEW OF SYSTEMS: SEE HPI, otherwise all systems are negative  PHYSICAL EXAMINATION:   Vital signs are  Filed Vitals:   09/04/15 1334  BP: 132/72  Pulse: 57  Temp: 97.1 F (36.2 C)  TempSrc: Oral  Resp: 14  Height: 5\' 10"  (1.778 m)  Weight: 193 lb (87.544 kg)  SpO2: 97%   Body mass index is 27.69 kg/(m^2). General: The patient appears their stated age. HEENT:  No gross abnormalities Pulmonary:  Non labored breathing Abdomen: Soft and non-tender.  No hernia Musculoskeletal: There are no major deformities. Neurologic: No focal weakness or paresthesias are detected, Skin: There are no ulcer or rashes noted. Psychiatric: The patient has normal affect. Cardiovascular: There is a regular rate and rhythm without  significant murmur appreciated. No carotid bruits   Diagnostic Studies I have reviewed his CT scan which shows no evidence of endoleak.  The stent graft remains in good position.  Diameter remained stable at 5.9 cm.  CT angiogram aortic diameter measurements: 11/2009:5.7 x 6.9 07/2010: 5.3 x 6.5 01/2014:5.9 x 5.1 08/2014: 6.0 x 5.1 08/2015:  5.9  Ultrasound aortic diameter measurements: 01/2012:4.6 01/2013:4.7 01/2014:5.3 x 5.6  Assessment: Status post endovascular aneurysm repair Plan: The patient's aneurysm remains stable with maximum diameter of 5.9 cm.  There is no evidence of endoleak.  I have recommended follow-up in 1 year with a ultrasound.  I will also check carotid Doppler studies as these were never done preoperatively the cause of his urgent presentation  V. Leia Alf, M.D. [facility] [place] Office: [phone] Pager:  [phone]

## 2015-11-01 DIAGNOSIS — C4372 Malignant melanoma of left lower limb, including hip: Secondary | ICD-10-CM

## 2015-11-01 HISTORY — DX: Malignant melanoma of left lower limb, including hip: C43.72

## 2015-12-20 DIAGNOSIS — C4372 Malignant melanoma of left lower limb, including hip: Secondary | ICD-10-CM | POA: Insufficient documentation

## 2016-06-25 DIAGNOSIS — Z125 Encounter for screening for malignant neoplasm of prostate: Secondary | ICD-10-CM | POA: Diagnosis not present

## 2016-09-09 ENCOUNTER — Encounter (HOSPITAL_COMMUNITY): Payer: Medicare Other

## 2016-09-09 ENCOUNTER — Ambulatory Visit: Payer: Medicare Other | Admitting: Surgery

## 2016-09-09 ENCOUNTER — Other Ambulatory Visit (HOSPITAL_COMMUNITY): Payer: Medicare Other

## 2016-10-04 ENCOUNTER — Encounter: Payer: Self-pay | Admitting: Surgery

## 2016-10-14 ENCOUNTER — Ambulatory Visit (INDEPENDENT_AMBULATORY_CARE_PROVIDER_SITE_OTHER): Payer: Medicare Other | Admitting: Surgery

## 2016-10-14 ENCOUNTER — Ambulatory Visit (HOSPITAL_COMMUNITY)
Admission: RE | Admit: 2016-10-14 | Discharge: 2016-10-14 | Disposition: A | Discharge status: Home / Self Care | Payer: Medicare Other | Source: Ambulatory Visit | Attending: Surgery | Admitting: Surgery

## 2016-10-14 ENCOUNTER — Ambulatory Visit (INDEPENDENT_AMBULATORY_CARE_PROVIDER_SITE_OTHER)
Admission: RE | Admit: 2016-10-14 | Discharge: 2016-10-14 | Disposition: A | Discharge status: Home / Self Care | Payer: Medicare Other | Source: Ambulatory Visit | Attending: Surgery | Admitting: Surgery

## 2016-10-14 ENCOUNTER — Encounter: Payer: Self-pay | Admitting: Surgery

## 2016-10-14 VITALS — BP 130/72 | HR 55 | Temp 97.1°F | Resp 20 | Ht 70.0 in | Wt 189.0 lb

## 2016-10-14 DIAGNOSIS — I714 Abdominal aortic aneurysm, without rupture, unspecified: Secondary | ICD-10-CM

## 2016-10-14 DIAGNOSIS — I6529 Occlusion and stenosis of unspecified carotid artery: Secondary | ICD-10-CM

## 2016-10-14 DIAGNOSIS — Z48812 Encounter for surgical aftercare following surgery on the circulatory system: Secondary | ICD-10-CM | POA: Diagnosis not present

## 2016-10-14 DIAGNOSIS — I6523 Occlusion and stenosis of bilateral carotid arteries: Secondary | ICD-10-CM | POA: Insufficient documentation

## 2016-10-14 LAB — VAS US CAROTID
LEFT ECA DIAS: -3 cm/s
LEFT VERTEBRAL DIAS: 14 cm/s
Left CCA dist dias: -14 cm/s
Left CCA dist sys: -63 cm/s
Left CCA prox dias: 14 cm/s
Left CCA prox sys: 69 cm/s
Left ICA dist dias: -15 cm/s
Left ICA dist sys: -58 cm/s
Left ICA prox dias: -21 cm/s
Left ICA prox sys: -103 cm/s
RIGHT CCA MID DIAS: 14 cm/s
RIGHT ECA DIAS: -13 cm/s
Right CCA prox dias: 12 cm/s
Right CCA prox sys: 75 cm/s
Right cca dist sys: -62 cm/s

## 2016-10-14 NOTE — Progress Notes (Signed)
Vascular and Vein Specialist of [facility]  Patient name: Danny Molina MRN: [id number] DOB: 02-19-1935 Sex: male   REASON FOR VISIT:    Follow up AAA  HISOTRY OF PRESENT ILLNESS:   The patient comes back today for further evaluation of his abdominal aortic aneurysm. He is status post urgent endovascular aneurysm repair on 09/21/2009, for a symptomatic aneurysm.  His surveillance ultrasound showed a significant increase in the size of his aneurysm.A CT scan was done which showed a 5.9 x 5.1 aneurysm which was completely excluded without evidence of endoleak.  He does not endorse any abdominal pain.  He has no back pain.  He does state that the muscles in his abdomen will get very hard causing him moderate discomfort.  He has been told that he has a torn muscle  He denies any neurologic symptoms such as numbness or weakness in either extremity or amaurosis fugax. PAST MEDICAL HISTORY:   Past Medical History:  Diagnosis Date  . Arthritis   . Diabetes mellitus   . Fall down stairs Dec. 2014   At a friends home  . GERD (gastroesophageal reflux disease)   . Hypertension      FAMILY HISTORY:   Family History  Problem Relation Age of Onset  . Heart disease Mother     Heart Disease before age 55  . Hypertension Mother   . Heart attack Mother   . Cancer Father   . Heart disease Father   . Heart attack Father   . Hypertension Sister   . Diabetes Sister   . Diabetes Brother   . Heart disease Brother   . Hypertension Brother   . Heart attack Brother   . Diabetes Brother     SOCIAL HISTORY:   Social History  Substance Use Topics  . Smoking status: Former Smoker    Types: Cigarettes    Quit date: 01/02/1993  . Smokeless tobacco: Never Used  . Alcohol use 0.6 oz/week    1 Shots of liquor per week     ALLERGIES:   Allergies  Allergen Reactions  . Penicillins Anaphylaxis and Rash     CURRENT MEDICATIONS:   Current  Outpatient Prescriptions  Medication Sig Dispense Refill  . aspirin 81 MG tablet Take 81 mg by mouth daily.    . chlorpheniramine (CHLOR-TRIMETON) 4 MG tablet Take 4 mg by mouth 2 (two) times daily as needed.    . doxazosin (CARDURA) 4 MG tablet Take 4 mg by mouth at bedtime. 1/2 tab qd    . fish oil-omega-3 fatty acids 1000 MG capsule Take 1,200 g by mouth daily.    . furosemide (LASIX) 20 MG tablet Take 20 mg by mouth daily. Reported on 09/04/2015    . gabapentin (NEURONTIN) 300 MG capsule Take 300 mg by mouth at bedtime.   12  . hydrochlorothiazide (HYDRODIURIL) 25 MG tablet Take 25 mg by mouth daily.    . metFORMIN (GLUCOPHAGE) 500 MG tablet Take 500 mg by mouth 2 (two) times daily with a meal.    . metoprolol (TOPROL-XL) 200 MG 24 hr tablet Take 200 mg by mouth daily. 1/2 tab qd    . Multiple Vitamin (MULTIVITAMIN) tablet Take 1 tablet by mouth daily.    Marland Kitchen omeprazole (PRILOSEC) 20 MG capsule Take 20 mg by mouth daily.    . valsartan (DIOVAN) 320 MG tablet Take 320 mg by mouth daily.     No current facility-administered medications for this visit.     REVIEW  OF SYSTEMS:   [X]  denotes positive finding, [ ]  denotes negative finding Cardiac  Comments:  Chest pain or chest pressure:    Shortness of breath upon exertion:    Short of breath when lying flat:    Irregular heart rhythm:        Vascular    Pain in calf, thigh, or hip brought on by ambulation:    Pain in feet at night that wakes you up from your sleep:     Blood clot in your veins:    Leg swelling:         Pulmonary    Oxygen at home:    Productive cough:     Wheezing:         Neurologic    Sudden weakness in arms or legs:     Sudden numbness in arms or legs:     Sudden onset of difficulty speaking or slurred speech:    Temporary loss of vision in one eye:     Problems with dizziness:         Gastrointestinal    Blood in stool:     Vomited blood:         Genitourinary    Burning when urinating:     Blood in  urine:        Psychiatric    Major depression:         Hematologic    Bleeding problems:    Problems with blood clotting too easily:        Skin    Rashes or ulcers:        Constitutional    Fever or chills:      PHYSICAL EXAM:   There were no vitals filed for this visit.  GENERAL: The patient is a well-nourished male, in no acute distress. The vital signs are documented above. CARDIAC: There is a regular rate and rhythm.  VASCULAR: No carotid bruits PULMONARY: Non-labored respirations ABDOMEN: Soft and non-tender.  No ventral hernia MUSCULOSKELETAL: There are no major deformities or cyanosis. NEUROLOGIC: No focal weakness or paresthesias are detected. SKIN: There are no ulcers or rashes noted. PSYCHIATRIC: The patient has a normal affect.  STUDIES:   CT angiogram aortic diameter measurements: 11/2009:5.7 x 6.9 07/2010: 5.3 x 6.5 01/2014:5.9 x 5.1 08/2014: 6.0 x 5.1 08/2015:  5.9  Ultrasound aortic diameter measurements: 01/2012:4.6 01/2013:4.7 01/2014:5.3 x 5.6  Today's ultrasound shows a aneurysm measuring 4.5 x 5.0  Carotid duplex shows no significant extracranial carotid stenosis bilaterally.  MEDICAL ISSUES:   AAA:  Ultrasound again has showed a smaller sized aneurysm than the CT scan.  I doubt that there are any concerns regarding the repair or size of the aneurysm, therefore I will continue with ultrasound surveillance in one year  Carotid stenosis: The patient does not have any significant carotid stenosis.  I will not order a repeat carotid duplex at this time.    Annamarie Major, MD Vascular and Vein Specialists of [facility] [phone] Pager [phone]

## 2016-10-18 NOTE — Addendum Note (Signed)
Addended by: Lianne Cure A on: 10/18/2016 03:14 PM   Modules accepted: Orders

## 2017-05-14 DIAGNOSIS — M12811 Other specific arthropathies, not elsewhere classified, right shoulder: Secondary | ICD-10-CM | POA: Insufficient documentation

## 2017-05-14 DIAGNOSIS — M170 Bilateral primary osteoarthritis of knee: Secondary | ICD-10-CM | POA: Insufficient documentation

## 2017-05-14 DIAGNOSIS — M19012 Primary osteoarthritis, left shoulder: Secondary | ICD-10-CM | POA: Insufficient documentation

## 2017-05-14 DIAGNOSIS — M25512 Pain in left shoulder: Secondary | ICD-10-CM | POA: Insufficient documentation

## 2017-05-14 DIAGNOSIS — M75101 Unspecified rotator cuff tear or rupture of right shoulder, not specified as traumatic: Secondary | ICD-10-CM | POA: Insufficient documentation

## 2017-10-20 ENCOUNTER — Encounter (HOSPITAL_COMMUNITY): Payer: Self-pay | Admitting: Emergency Medicine

## 2017-10-20 ENCOUNTER — Ambulatory Visit: Payer: Medicare Other | Admitting: Surgery

## 2017-10-20 ENCOUNTER — Other Ambulatory Visit (HOSPITAL_COMMUNITY): Payer: Medicare Other

## 2017-10-20 ENCOUNTER — Emergency Department (HOSPITAL_COMMUNITY)
Admission: EM | Admit: 2017-10-20 | Discharge: 2017-10-20 | Disposition: A | Discharge status: Home / Self Care | Payer: Medicare Other | Attending: Emergency Medicine | Admitting: Emergency Medicine

## 2017-10-20 DIAGNOSIS — Z7984 Long term (current) use of oral hypoglycemic drugs: Secondary | ICD-10-CM | POA: Insufficient documentation

## 2017-10-20 DIAGNOSIS — Z85820 Personal history of malignant melanoma of skin: Secondary | ICD-10-CM | POA: Diagnosis not present

## 2017-10-20 DIAGNOSIS — Z87891 Personal history of nicotine dependence: Secondary | ICD-10-CM | POA: Diagnosis not present

## 2017-10-20 DIAGNOSIS — M25461 Effusion, right knee: Secondary | ICD-10-CM | POA: Diagnosis not present

## 2017-10-20 DIAGNOSIS — Z7982 Long term (current) use of aspirin: Secondary | ICD-10-CM | POA: Insufficient documentation

## 2017-10-20 DIAGNOSIS — E119 Type 2 diabetes mellitus without complications: Secondary | ICD-10-CM | POA: Diagnosis not present

## 2017-10-20 DIAGNOSIS — M25561 Pain in right knee: Secondary | ICD-10-CM | POA: Diagnosis present

## 2017-10-20 DIAGNOSIS — I10 Essential (primary) hypertension: Secondary | ICD-10-CM | POA: Diagnosis not present

## 2017-10-20 DIAGNOSIS — Z79899 Other long term (current) drug therapy: Secondary | ICD-10-CM | POA: Diagnosis not present

## 2017-10-20 LAB — SYNOVIAL CELL COUNT + DIFF, W/ CRYSTALS
Crystals, Fluid: NONE SEEN
Eosinophils-Synovial: 0 % (ref 0–1)
Lymphocytes-Synovial Fld: 5 % (ref 0–20)
Monocyte-Macrophage-Synovial Fluid: 2 % — ABNORMAL LOW (ref 50–90)
Neutrophil, Synovial: 93 % — ABNORMAL HIGH (ref 0–25)
Other Cells-SYN: 0
WBC, Synovial: 2520 /mm3 — ABNORMAL HIGH (ref 0–200)

## 2017-10-20 LAB — GRAM STAIN: Special Requests: NORMAL

## 2017-10-20 MED ORDER — LIDOCAINE-EPINEPHRINE (PF) 2 %-1:200000 IJ SOLN
20.0000 mL | Freq: Once | INTRAMUSCULAR | Status: AC
  Administered 2017-10-20: 20 mL via INTRADERMAL
  Filled 2017-10-20: qty 20

## 2017-10-20 NOTE — ED Triage Notes (Signed)
Pt reports right knee has been swelling with pain since Monday.  States hx of same with having to have fluid drawn off.  Denies injury.

## 2017-10-20 NOTE — Discharge Instructions (Signed)
Please follow up with orthopedics Please watch for signs of worsening redness, pain, or drainage

## 2017-10-20 NOTE — ED Provider Notes (Signed)
[facility] EMERGENCY DEPARTMENT Provider Note   CSN: [id number] Arrival date & time: 10/20/17  1115     History   Chief Complaint Chief Complaint  Patient presents with  . Knee Pain    HPI Danny Molina is a 83 y.o. male who presents with right knee pain and swelling.  Past medical history significant for arthritis of the bilateral knees, recurrent knee effusions.  He states that he previously had an orthopedic doctor who would draw fluid off his knee every so often.  Last time he had it drawn off or had any knee injections was over 6 months ago.  Over the past week he has had gradual worsening swelling in his knee.  He denies any trauma or falls.  He is tried elevating and icing the knee without significant relief.  He tried to follow-up with his orthopedic doctor however he found that he had left the practice and he would not be able to get in with the doctor who took over.  He was advised to call his PCP.  His PCP advised him to come to the ED because he would not be able to do that procedure.  Patient denies fever, redness, inability to walk.  He mostly complains of warmth and decreased range of motion of the knee.  He denies history of septic joint or gout.  HPI  Past Medical History:  Diagnosis Date  . Arthritis   . Diabetes mellitus   . Fall down stairs Dec. 2014   At a friends home  . GERD (gastroesophageal reflux disease)   . Hypertension   . Malignant melanoma of left lower leg [facility])     Patient Active Problem List   Diagnosis Date Noted  . Aftercare following surgery of the circulatory system, [place] 01/24/2014  . Abdominal aneurysm without mention of rupture 01/13/2012    Past Surgical History:  Procedure Laterality Date  . ABDOMINAL AORTIC ANEURYSM REPAIR  09/21/2009  . LEG SURGERY Left        Home Medications    Prior to Admission medications   Medication Sig Start Date End Date Taking? Authorizing Provider  acitretin (SORIATANE) 10 MG capsule Take 10  mg by mouth daily before breakfast.   Yes [provider]  amLODipine (NORVASC) 10 MG tablet Take 5 mg by mouth daily.   Yes [provider]  aspirin 81 MG tablet Take 81 mg by mouth daily.   Yes [provider]  chlorpheniramine (CHLOR-TRIMETON) 4 MG tablet Take 4 mg by mouth 2 (two) times daily as needed.   Yes [provider]  doxazosin (CARDURA) 8 MG tablet Take 4 mg by mouth at bedtime. 1/2 tab qd   Yes [provider]  fish oil-omega-3 fatty acids 1000 MG capsule Take 1,200 g by mouth 2 (two) times daily.    Yes [provider]  furosemide (LASIX) 20 MG tablet Take 20 mg by mouth daily. Reported on 09/04/2015   Yes [provider]  metFORMIN (GLUCOPHAGE) 500 MG tablet Take 500 mg by mouth 2 (two) times daily with a meal.   Yes [provider]  metoprolol (TOPROL-XL) 200 MG 24 hr tablet Take 100 mg by mouth daily.    Yes [provider]  Multiple Vitamin (MULTIVITAMIN) tablet Take 1 tablet by mouth daily.   Yes [provider]  omeprazole (PRILOSEC) 20 MG capsule Take 20 mg by mouth daily.   Yes [provider]  valsartan (DIOVAN) 320 MG tablet Take  320 mg by mouth daily.   Yes [provider]    Family History Family History  Problem Relation Age of Onset  . Heart disease Mother        Heart Disease before age 39  . Hypertension Mother   . Heart attack Mother   . Cancer Father   . Heart disease Father   . Heart attack Father   . Hypertension Sister   . Diabetes Sister   . Diabetes Brother   . Heart disease Brother   . Hypertension Brother   . Heart attack Brother   . Diabetes Brother     Social History Social History   Tobacco Use  . Smoking status: Former Smoker    Types: Cigarettes    Last attempt to quit: 01/02/1993    Years since quitting: 24.8  . Smokeless tobacco: Never Used  Substance Use Topics  . Alcohol use: Yes    Alcohol/week: 0.6 oz    Types: 1  Shots of liquor per week  . Drug use: No     Allergies   Penicillins   Review of Systems Review of Systems  Constitutional: Negative for fever.  Musculoskeletal: Positive for arthralgias and joint swelling.  Skin: Negative for color change and wound.  Neurological: Negative for weakness and numbness.  All other systems reviewed and are negative.    Physical Exam Updated Vital Signs BP (!) 148/76   Pulse 69   Temp 97.8 F (36.6 C) (Oral)   Resp 20   Ht 5\' 10"  (1.778 m)   Wt 88.5 kg (195 lb)   SpO2 100%   BMI 27.98 kg/m   Physical Exam  Constitutional: He is oriented to person, place, and time. He appears well-developed and well-nourished. No distress.  Elderly male in no acute distress  HENT:  Head: Normocephalic and atraumatic.  Eyes: Conjunctivae are normal. Pupils are equal, round, and reactive to light. Right eye exhibits no discharge. Left eye exhibits no discharge. No scleral icterus.  Neck: Normal range of motion.  Cardiovascular: Normal rate.  Pulmonary/Chest: Effort normal. No respiratory distress.  Abdominal: He exhibits no distension.  Musculoskeletal:  Right knee: Moderate to large joint effusion with associated warmth.  No redness.  Limited range of motion without severe pain.  N/V intact.  Ambulatory with a cane   Neurological: He is alert and oriented to person, place, and time.  Skin: Skin is warm and dry.  Psychiatric: He has a normal mood and affect. His behavior is normal.  Nursing note and vitals reviewed.    ED Treatments / Results  Labs (all labs ordered are listed, but only abnormal results are displayed) Labs Reviewed  SYNOVIAL CELL COUNT + DIFF, W/ CRYSTALS - Abnormal; Notable for the following components:      Result Value   Appearance-Synovial CLOUDY (*)    WBC, Synovial 2,520 (*)    Neutrophil, Synovial 93 (*)    Monocyte-Macrophage-Synovial Fluid 2 (*)    All other components within normal limits  GRAM STAIN  CULTURE, BODY  FLUID-BOTTLE  GLUCOSE, BODY FLUID OTHER  PROTEIN, BODY FLUID (OTHER)    EKG  EKG Interpretation None       Radiology No results found.  Procedures .Joint Aspiration/Arthrocentesis Date/Time: 10/20/2017 4:37 PM Performed by: Recardo Evangelist, PA-C Authorized by: Recardo Evangelist, PA-C   Consent:    Consent obtained:  Verbal   Consent given by:  Patient   Risks discussed:  Bleeding, infection, pain and incomplete drainage  Alternatives discussed:  No treatment Location:    Location:  Knee   Knee:  R knee Anesthesia (see MAR for exact dosages):    Anesthesia method:  Local infiltration   Local anesthetic:  Lidocaine 2% WITH epi Procedure details:    Preparation: Patient was prepped and draped in usual sterile fashion     Needle gauge:  18 G   Ultrasound guidance: yes     Approach:  Lateral   Aspirate amount:  50cc   Aspirate characteristics:  Yellow   Steroid injected: no     Specimen collected: yes   Post-procedure details:    Dressing:  Adhesive bandage   Patient tolerance of procedure:  Tolerated well, no immediate complications   (including critical care time)    Medications Ordered in ED Medications  lidocaine-EPINEPHrine (XYLOCAINE W/EPI) 2 %-1:200000 (PF) injection 20 mL (20 mLs Intradermal Given 10/20/17 1510)     Initial Impression / Assessment and Plan / ED Course  I have reviewed the triage vital signs and the nursing notes.  Pertinent labs & imaging results that were available during my care of the patient were reviewed by me and considered in my medical decision making (see chart for details).  83 year old male presents with a right knee effusion.  His vital signs are normal.  He is well-appearing.  Exam is not consistent with a septic joint or gout.  He is requesting a therapeutic knee arthrocentesis.  This is a shared visit with Dr. Lita Mains who also evaluated the patient.  Approximately 50 cc of fluid was drawn off.  Aspirate was  sent for testing which is pending at time of discharge but did not appear purulent.  Patient was advised to establish care with a new orthopedic doctor and return if worsening.  Final Clinical Impressions(s) / ED Diagnoses   Final diagnoses:  Effusion of right knee    ED Discharge Orders    None       Recardo Evangelist, PA-C 10/20/17 1641    Julianne Rice, MD 10/22/17 [phone]

## 2017-10-21 LAB — GLUCOSE, BODY FLUID OTHER: Glucose, Body Fluid Other: 99 mg/dL

## 2017-10-21 LAB — PROTEIN, BODY FLUID (OTHER): Total Protein, Body Fluid Other: 4.6 g/dL

## 2017-10-25 LAB — CULTURE, BODY FLUID W GRAM STAIN -BOTTLE: Culture: NO GROWTH

## 2017-11-17 ENCOUNTER — Encounter: Payer: Self-pay | Admitting: Surgery

## 2017-11-17 ENCOUNTER — Ambulatory Visit (HOSPITAL_COMMUNITY)
Admission: RE | Admit: 2017-11-17 | Discharge: 2017-11-17 | Disposition: A | Discharge status: Home / Self Care | Payer: Medicare Other | Source: Ambulatory Visit | Attending: Surgery | Admitting: Surgery

## 2017-11-17 ENCOUNTER — Ambulatory Visit (INDEPENDENT_AMBULATORY_CARE_PROVIDER_SITE_OTHER): Payer: Medicare Other | Admitting: Surgery

## 2017-11-17 ENCOUNTER — Other Ambulatory Visit: Payer: Self-pay

## 2017-11-17 VITALS — BP 127/72 | HR 46 | Temp 97.2°F | Resp 18 | Ht 69.5 in | Wt 192.0 lb

## 2017-11-17 DIAGNOSIS — I714 Abdominal aortic aneurysm, without rupture, unspecified: Secondary | ICD-10-CM

## 2017-11-17 DIAGNOSIS — Z6827 Body mass index (BMI) 27.0-27.9, adult: Secondary | ICD-10-CM | POA: Insufficient documentation

## 2017-11-17 DIAGNOSIS — Z87891 Personal history of nicotine dependence: Secondary | ICD-10-CM | POA: Diagnosis not present

## 2017-11-17 DIAGNOSIS — K8080 Other cholelithiasis without obstruction: Secondary | ICD-10-CM | POA: Diagnosis not present

## 2017-11-17 DIAGNOSIS — E669 Obesity, unspecified: Secondary | ICD-10-CM | POA: Insufficient documentation

## 2017-11-17 NOTE — Progress Notes (Signed)
Vascular and Vein Specialist of [facility]  Patient name: Danny Molina MRN: [id number] DOB: 09/03/1934 Sex: male   REASON FOR VISIT:    Follow up  [place]:    The patient comes back today for further evaluation of his abdominal aortic aneurysm. He is status post urgent endovascular aneurysm repair on 09/21/2009, for a symptomatic aneurysm.  His surveillance ultrasound showed a significant increase in the size of his aneurysm.A CT scan was done which showed a 5.9 x 5.1 aneurysm which was completely excluded without evidence of endoleak.  He does not endorse any abdominal pain.  He has no back pain.   He denies any neurologic symptoms    PAST MEDICAL HISTORY:   Past Medical History:  Diagnosis Date  . Arthritis   . Diabetes mellitus   . Fall down stairs Dec. 2014   At a friends home  . GERD (gastroesophageal reflux disease)   . Hypertension   . Malignant melanoma of left lower leg (HCC)      FAMILY HISTORY:   Family History  Problem Relation Age of Onset  . Heart disease Mother        Heart Disease before age 5  . Hypertension Mother   . Heart attack Mother   . Cancer Father   . Heart disease Father   . Heart attack Father   . Hypertension Sister   . Diabetes Sister   . Diabetes Brother   . Heart disease Brother   . Hypertension Brother   . Heart attack Brother   . Diabetes Brother     SOCIAL HISTORY:   Social History   Tobacco Use  . Smoking status: Former Smoker    Types: Cigarettes    Last attempt to quit: 01/02/1993    Years since quitting: 24.8  . Smokeless tobacco: Never Used  Substance Use Topics  . Alcohol use: Yes    Alcohol/week: 0.6 oz    Types: 1 Shots of liquor per week     ALLERGIES:   Allergies  Allergen Reactions  . Penicillins Anaphylaxis and Rash  . Diclofenac Other (See Comments)     CURRENT MEDICATIONS:   Current Outpatient Medications  Medication Sig  Dispense Refill  . acitretin (SORIATANE) 10 MG capsule Take 10 mg by mouth daily before breakfast.    . amLODipine (NORVASC) 10 MG tablet Take 5 mg by mouth daily.    Marland Kitchen aspirin 81 MG tablet Take 81 mg by mouth daily.    . chlorpheniramine (CHLOR-TRIMETON) 4 MG tablet Take 4 mg by mouth 2 (two) times daily as needed.    . doxazosin (CARDURA) 8 MG tablet Take 4 mg by mouth at bedtime. 1/2 tab qd    . fish oil-omega-3 fatty acids 1000 MG capsule Take 1,200 g by mouth 2 (two) times daily.     . furosemide (LASIX) 20 MG tablet Take 20 mg by mouth daily. Reported on 09/04/2015    . metFORMIN (GLUCOPHAGE) 500 MG tablet Take 500 mg by mouth 2 (two) times daily with a meal.    . metoprolol (TOPROL-XL) 200 MG 24 hr tablet Take 100 mg by mouth daily.     . Multiple Vitamin (MULTIVITAMIN) tablet Take 1 tablet by mouth daily.    Marland Kitchen omeprazole (PRILOSEC) 20 MG capsule Take 20 mg by mouth daily.    . valsartan (DIOVAN) 320 MG tablet Take 320 mg by mouth daily.     No current facility-administered medications for this visit.  REVIEW OF SYSTEMS:   [X]  denotes positive finding, [ ]  denotes negative finding Cardiac  Comments:  Chest pain or chest pressure:    Shortness of breath upon exertion:    Short of breath when lying flat:    Irregular heart rhythm:        Vascular    Pain in calf, thigh, or hip brought on by ambulation:    Pain in feet at night that wakes you up from your sleep:     Blood clot in your veins:    Leg swelling:         Pulmonary    Oxygen at home:    Productive cough:     Wheezing:         Neurologic    Sudden weakness in arms or legs:     Sudden numbness in arms or legs:     Sudden onset of difficulty speaking or slurred speech:    Temporary loss of vision in one eye:     Problems with dizziness:         Gastrointestinal    Blood in stool:     Vomited blood:         Genitourinary    Burning when urinating:     Blood in urine:        Psychiatric    Major  depression:         Hematologic    Bleeding problems:    Problems with blood clotting too easily:        Skin    Rashes or ulcers:        Constitutional    Fever or chills:      PHYSICAL EXAM:   Vitals:   11/17/17 0838  BP: 127/72  Pulse: (!) 46  Resp: 18  Temp: (!) 97.2 F (36.2 C)  TempSrc: Oral  SpO2: 95%  Weight: 192 lb (87.1 kg)  Height: 5' 9.5" (1.765 m)    GENERAL: The patient is a well-nourished male, in no acute distress. The vital signs are documented above. CARDIAC: There is a regular rate and rhythm.  VASCULAR: palpable popliteal pulses PULMONARY: Non-labored respirations ABDOMEN: Soft and non-tender with normal pitched bowel sounds.  MUSCULOSKELETAL: There are no major deformities or cyanosis. NEUROLOGIC: No focal weakness or paresthesias are detected. SKIN: There are no ulcers or rashes noted. PSYCHIATRIC: The patient has a normal affect.  STUDIES:   I have ordered and reviewed his ultrasound.  This shows a maximum aortic diameter 5 cm  MEDICAL ISSUES:   Status post emergent endovascular aneurysm repair approximately 7 years ago.  By ultrasound, his aneurysm sac remains stable.  He will follow-up in 1 year with repeat ultrasound.    Annamarie Major, MD Vascular and Vein Specialists of [facility] [phone] Pager [phone]

## 2018-01-19 DIAGNOSIS — C4442 Squamous cell carcinoma of skin of scalp and neck: Secondary | ICD-10-CM | POA: Diagnosis not present

## 2018-01-19 DIAGNOSIS — C4492 Squamous cell carcinoma of skin, unspecified: Secondary | ICD-10-CM

## 2018-01-19 HISTORY — DX: Squamous cell carcinoma of skin, unspecified: C44.92

## 2018-05-04 DIAGNOSIS — E119 Type 2 diabetes mellitus without complications: Secondary | ICD-10-CM | POA: Insufficient documentation

## 2018-05-04 DIAGNOSIS — E559 Vitamin D deficiency, unspecified: Secondary | ICD-10-CM | POA: Insufficient documentation

## 2018-05-04 DIAGNOSIS — Z860100 Personal history of colon polyps, unspecified: Secondary | ICD-10-CM | POA: Insufficient documentation

## 2018-05-04 DIAGNOSIS — Z95828 Presence of other vascular implants and grafts: Secondary | ICD-10-CM | POA: Insufficient documentation

## 2018-05-04 DIAGNOSIS — N400 Enlarged prostate without lower urinary tract symptoms: Secondary | ICD-10-CM | POA: Insufficient documentation

## 2018-05-04 DIAGNOSIS — H9190 Unspecified hearing loss, unspecified ear: Secondary | ICD-10-CM | POA: Insufficient documentation

## 2018-05-04 DIAGNOSIS — J309 Allergic rhinitis, unspecified: Secondary | ICD-10-CM | POA: Insufficient documentation

## 2018-05-04 DIAGNOSIS — I10 Essential (primary) hypertension: Secondary | ICD-10-CM | POA: Insufficient documentation

## 2018-05-04 DIAGNOSIS — L409 Psoriasis, unspecified: Secondary | ICD-10-CM | POA: Insufficient documentation

## 2018-07-08 DIAGNOSIS — I10 Essential (primary) hypertension: Secondary | ICD-10-CM | POA: Diagnosis not present

## 2019-02-25 ENCOUNTER — Encounter: Payer: Self-pay | Admitting: *Deleted

## 2019-04-13 ENCOUNTER — Other Ambulatory Visit: Payer: Self-pay | Admitting: Internal Medicine

## 2019-04-13 DIAGNOSIS — R1084 Generalized abdominal pain: Secondary | ICD-10-CM

## 2019-04-16 ENCOUNTER — Ambulatory Visit
Admission: RE | Admit: 2019-04-16 | Discharge: 2019-04-16 | Disposition: A | Discharge status: Home / Self Care | Payer: Medicare Other | Source: Ambulatory Visit | Attending: Internal Medicine | Admitting: Internal Medicine

## 2019-04-16 DIAGNOSIS — R1084 Generalized abdominal pain: Secondary | ICD-10-CM

## 2019-04-16 MED ORDER — IOPAMIDOL (ISOVUE-300) INJECTION 61%
100.0000 mL | Freq: Once | INTRAVENOUS | Status: AC | PRN
  Administered 2019-04-16: 100 mL via INTRAVENOUS

## 2019-04-30 ENCOUNTER — Other Ambulatory Visit: Payer: Self-pay | Admitting: Internal Medicine

## 2019-04-30 DIAGNOSIS — R911 Solitary pulmonary nodule: Secondary | ICD-10-CM

## 2019-05-06 ENCOUNTER — Ambulatory Visit
Admission: RE | Admit: 2019-05-06 | Discharge: 2019-05-06 | Disposition: A | Discharge status: Home / Self Care | Payer: Medicare Other | Source: Ambulatory Visit | Attending: Internal Medicine | Admitting: Internal Medicine

## 2019-05-06 ENCOUNTER — Other Ambulatory Visit: Payer: Self-pay

## 2019-05-06 DIAGNOSIS — I714 Abdominal aortic aneurysm, without rupture, unspecified: Secondary | ICD-10-CM

## 2019-05-06 DIAGNOSIS — R911 Solitary pulmonary nodule: Secondary | ICD-10-CM

## 2019-05-11 ENCOUNTER — Telehealth (HOSPITAL_COMMUNITY): Payer: Self-pay | Admitting: *Deleted

## 2019-05-11 NOTE — Telephone Encounter (Signed)
The above patient or their representative was contacted and gave the following answers to these questions:         Do you have any of the following symptoms?  Fever                    Cough                   Shortness of breath  Do  you have any of the following other symptoms? n   muscle pain         vomiting,        diarrhea        rash         weakness        red eye        abdominal pain         bruising          bruising or bleeding              joint pain           severe headache    Have you been in contact with someone who was or has been sick in the past 2 weeks?n  Yes                 Unsure                         Unable to assess   Does the person that you were in contact with have any of the following symptoms?   Cough         shortness of breath           muscle pain         vomiting,            diarrhea            rash            weakness           fever            red eye           abdominal pain           bruising  or  bleeding                joint pain                severe headache               Have you  or someone you have been in contact with traveled internationally in th last month?    n     If yes, which countries?   Have you  or someone you have been in contact with traveled outside [place] in th last month?      n   If yes, which state and city?   COMMENTS OR ACTION PLAN FOR THIS PATIENT:

## 2019-05-12 ENCOUNTER — Ambulatory Visit (HOSPITAL_COMMUNITY)
Admission: RE | Admit: 2019-05-12 | Discharge: 2019-05-12 | Disposition: A | Discharge status: Home / Self Care | Payer: Medicare Other | Source: Ambulatory Visit | Attending: Family | Admitting: Family

## 2019-05-12 ENCOUNTER — Encounter: Payer: Self-pay | Admitting: Family

## 2019-05-12 ENCOUNTER — Other Ambulatory Visit: Payer: Self-pay

## 2019-05-12 ENCOUNTER — Ambulatory Visit (INDEPENDENT_AMBULATORY_CARE_PROVIDER_SITE_OTHER): Payer: Medicare Other | Admitting: Family

## 2019-05-12 VITALS — BP 136/70 | HR 56 | Temp 97.6°F | Resp 20 | Ht 69.5 in | Wt 188.0 lb

## 2019-05-12 DIAGNOSIS — Z95828 Presence of other vascular implants and grafts: Secondary | ICD-10-CM | POA: Diagnosis not present

## 2019-05-12 DIAGNOSIS — I714 Abdominal aortic aneurysm, without rupture, unspecified: Secondary | ICD-10-CM

## 2019-05-12 DIAGNOSIS — Z87891 Personal history of nicotine dependence: Secondary | ICD-10-CM | POA: Diagnosis not present

## 2019-05-12 NOTE — Patient Instructions (Signed)
Before your next abdominal ultrasound:  Avoid gas forming foods and beverages the day before the test.   Take two Extra-Strength Gas-X capsules at bedtime the night before the test. Take another two Extra-Strength Gas-X capsules in the middle of the night if you get up to the restroom, if not, first thing in the morning with water.  Do not chew gum.     

## 2019-05-12 NOTE — Progress Notes (Signed)
[facility] [place]  CC: Follow up s/p Endovascular Repair of Abdominal Aortic Aneurysm    History of Present Illness  Danny Molina is a 84 y.o. (1935-02-26) male who is status post urgent endovascular aneurysm repair on 09/21/2009 by Dr. Trula Slade for a symptomatic aneurysm.   A surveillance ultrasound showed a significant increase in the size of his aneurysm.A CT scan was done which showed a 5.9 x 5.1 aneurysm which was completely excluded without evidence of endoleak.  Dr. Trula Slade last evaluated pt on 11-17-17. At that time ultrasound showed a maximum aortic diameter of 5 cm. By ultrasound, his aneurysm sac remained stable. Dr. Trula Slade advised follow-up in 1 year with repeat ultrasound.  Pt returns today for follow up.   He does not endorse any abdominal pain. He has no back pain.  He denies any neurologic symptoms.  He states his cholesterol is good and does not take a statin. Pt denies any history of stroke or TIA. He does not walk much due to cracking and bleeding on his soles, sees a dermatologist. He also has radiculopathy.   His medications include a daily 81 mg ASA and a beta blocker.   Pt states he has had a CT of his chest and his abdomen on 04-16-19 requested by his PCP, to evaluate long standing hx of upset stomach, states no etiology found for his upset stomach.   He is wearing a left ankle/foot brace to correct excessive pronation.    Diabetic: Yes, states his last A1C was 5.6 at the [place] [employer] use: former smoker, quit in 1994   Past Medical History:  Diagnosis Date  . Arthritis   . BCC (basal cell carcinoma of skin) 05/20/2005   LEFT POST SHOULDER- TX CURET X 3,5FU  . BCC (basal cell carcinoma of skin) 12/02/2011   NOSE-TX MOHS  . BCC (basal cell carcinoma of skin) 07/15/2016   SUPERFICIAL- RIGHT UPPER BACK- TX CURET AFTER BIOPSY  . BCC (basal cell carcinoma of skin) 07/15/2016   SUPERFICIAL/ULCERATED RIGHT FOREARM- TX CURET AFRER  BIOPSY  . BCC (basal cell carcinoma of skin) 07/06/2018   SUPERFICIAL,NODULAR, INFLITRATIVE- LEFT NOSE- TX MOHS  . Diabetes mellitus   . Fall down stairs Dec. 2014   At a friends home  . GERD (gastroesophageal reflux disease)   . Hypertension   . Malignant melanoma of left lower leg ([place]) 11/01/2015  . SCC (squamous cell carcinoma) 01/19/2018   RIGHT SIDEBURN- TX CURET X 3,EXCISION, 5FU  . SCC (squamous cell carcinoma) 01/19/2018   WELL DIFF-LEFT SUP CROWN SCALP- TX CURET AFTER BIOPSY  . SCCA (squamous cell carcinoma) of skin 11/22/2010   RIGHT SCALP POSTERIOR- TX CURET AFTER BIOPSY  . SCCA (squamous cell carcinoma) of skin 11/22/2010   RIGHT SCALP MIDDLE- TX CURET AFTER BIOPSY  . SCCA (squamous cell carcinoma) of skin 11/22/2010   RIGHT SCALP ANTERIOR-TX CURET AFTER BIOPSY  . SCCA (squamous cell carcinoma) of skin 03/07/2011   RIGHT CROWN- TX CURET AFTER BIOPSY  . SCCA (squamous cell carcinoma) of skin 10/12/2012   RIGHT CROWN POST- TX CURET AFTER BIOPSY  . SCCA (squamous cell carcinoma) of skin 11/19/2012   CROWN- TX CURET AFTER BIOPSY  . SCCA (squamous cell carcinoma) of skin 12/15/2013   RIGHT FOREHEAD- TX CURET AFTER BIOPSY  . SCCA (squamous cell carcinoma) of skin 05/23/2015   LEFT SCALP- TX CURET AFTER BIOPSY  . SCCA (squamous cell carcinoma) of skin 05/23/2015   CENTER SCALP- TX CURET AFTER  BIOPSY  . SCCA (squamous cell carcinoma) of skin 05/23/2015   MID CROWN SCALP- TX CURET AFTER BIOPSY  . SCCA (squamous cell carcinoma) of skin 05/23/2015   RIGHT CROWN MEDIAL- TX CURET AFTER BIOPSY  . SCCA (squamous cell carcinoma) of skin 05/23/2015   RIGHT CROWN LATERAL- TX CURET AFTER BIOPSY  . SCCA (squamous cell carcinoma) of skin 01/19/2018   RIGHT CROWN SUPERIOR- TX CURET AFTER BIOPSY  . SCCA (squamous cell carcinoma) of skin 01/19/2018   RIGHT CROWN INFERIOR- TX CURET AFTER BIOPSY  . SCCA (squamous cell carcinoma) of skin 07/06/2018   RIGHT FRONT SCALP INFERIOR- TX CURET  AFTER BIOPSY  . SCCA (squamous cell carcinoma) of skin 07/06/2018   RIGHT FRONT SCALP, SUPERIOR- TX CURET AFTER BIOPSY  . SCCA (squamous cell carcinoma) of skin 07/06/2018   RIGHT MEDIAL SCALP- TX CURET AFTER BIOPSY  . SCCA (squamous cell carcinoma) of skin 07/06/2018   RIGHT POST SCALP- CURET AFTER BIOPSY  . SCCA (squamous cell carcinoma) of skin 08/27/2018   RIGHT CHEEK- TX CURET AFTER BIOPSY   Past Surgical History:  Procedure Laterality Date  . ABDOMINAL AORTIC ANEURYSM REPAIR  09/21/2009  . LEG SURGERY Left    Social History Social History   Tobacco Use  . Smoking status: Former Smoker    Types: Cigarettes    Quit date: 01/02/1993    Years since quitting: 26.3  . Smokeless tobacco: Never Used  Substance Use Topics  . Alcohol use: Yes    Alcohol/week: 1.0 standard drinks    Types: 1 Shots of liquor per week  . Drug use: No   Family History Family History  Problem Relation Age of Onset  . Heart disease Mother        Heart Disease before age 90  . Hypertension Mother   . Heart attack Mother   . Cancer Father   . Heart disease Father   . Heart attack Father   . Hypertension Sister   . Diabetes Sister   . Diabetes Brother   . Heart disease Brother   . Hypertension Brother   . Heart attack Brother   . Diabetes Brother    Current Outpatient Medications on File Prior to Visit  Medication Sig Dispense Refill  . acitretin (SORIATANE) 10 MG capsule Take 10 mg by mouth daily before breakfast.    . amLODipine (NORVASC) 10 MG tablet Take 5 mg by mouth daily.    Marland Kitchen aspirin 81 MG tablet Take 81 mg by mouth daily.    . chlorpheniramine (CHLOR-TRIMETON) 4 MG tablet Take 4 mg by mouth 2 (two) times daily as needed.    . doxazosin (CARDURA) 8 MG tablet Take 4 mg by mouth at bedtime. 1/2 tab qd    . fish oil-omega-3 fatty acids 1000 MG capsule Take 1,200 g by mouth 2 (two) times daily.     . furosemide (LASIX) 20 MG tablet Take 20 mg by mouth daily. Reported on 09/04/2015    .  metFORMIN (GLUCOPHAGE) 500 MG tablet Take 500 mg by mouth 2 (two) times daily with a meal.    . metoprolol (TOPROL-XL) 200 MG 24 hr tablet Take 100 mg by mouth daily.     . Multiple Vitamin (MULTIVITAMIN) tablet Take 1 tablet by mouth daily.    Marland Kitchen omeprazole (PRILOSEC) 20 MG capsule Take 20 mg by mouth daily.    . valsartan (DIOVAN) 320 MG tablet Take 320 mg by mouth daily.     No current facility-administered medications on  file prior to visit.    Allergies  Allergen Reactions  . Penicillins Anaphylaxis and Rash  . Diclofenac Other (See Comments)     ROS: See HPI for pertinent positives and negatives.  Physical Examination  Vitals:   05/12/19 0916  BP: 136/70  Pulse: (!) 56  Resp: 20  Temp: 97.6 F (36.4 C)  SpO2: 99%  Weight: 188 lb (85.3 kg)  Height: 5' 9.5" (1.765 m)   Body mass index is 27.36 kg/m.  General: A&O x 3, WD, elderly male accompanied by his wife HEENT: No gross abnormalities  Pulmonary: Sym exp, respirations are non labored, good air movement in all fields CTAB, no rales, rhonchi, or wheezes Cardiac: Regular rhythm and rate, no murmur appreciated  Vascular: Vessel Right Left  Radial Palpable Palpable  Carotid  without bruit  without bruit  Aorta Not palpable N/A  Femoral 2+Palpable 2+Palpable  Popliteal Not palpable Not palpable  PT Palpable Ankle/foot brace on  DP Palpable Ankle/foot brace on   Gastrointestinal: soft, NTND, -G/R, - HSM, - palpable masses, - CVAT B. Musculoskeletal: M/S 5/5 throughout, extremities without ischemic changes Skin: No rashes, no ulcers, no cellulitis.   Neurologic: Pain and light touch intact in extremities, Motor exam as listed above. CN 2-12 grossly intact except has significant hearing loss.  Psychiatric: Normal thought content, mood appropriate for clinical situation.     DATA  EVAR Duplex   Current (Date: 05-12-19) Endovascular Aortic Repair  (EVAR): +----------+----------------+----------------+------------------+--------------+           Diameter AP (cm)Diameter Trans  Velocities        Comments                                 (cm)            (cm/sec)                         +----------+----------------+----------------+------------------+--------------+ Aorta     5.05            4.89            79                               +----------+----------------+----------------+------------------+--------------+ Right Limb1.39            1.41            61                               +----------+----------------+----------------+------------------+--------------+ Left Limb                                                   not visualized +----------+----------------+----------------+------------------+--------------+  Summary: Abdominal Aorta: Patent endovascular aneurysm repair with no evidence of endoleak. The largest aortic diameter remains essentially unchanged compared to prior exam. Previous diameter measurement was 5.0 cm obtained on 11/17/2017.    Medical Decision Making  Danny Molina is a 84 y.o. male who presents s/p EVAR (Date: 09/21/2009).  Pt is asymptomatic with stable sac size at 5 cm.  I discussed with the patient the importance of surveillance of the endograft.  The  next endograft duplex will be scheduled for 12 months.  The patient will follow up with [place] in 12 months with these studies.  I emphasized the importance of maximal medical management including strict control of blood pressure, blood glucose, and lipid levels, antiplatelet agents, obtaining regular exercise, and cessation of smoking.   Thank you for allowing [place] to participate in this patient's care.  Clemon Chambers, RN, MSN, FNP-C [facility] [place] Office: [facility] Physician: Oneida Alar  05/12/2019, 9:21 AM

## 2019-08-19 ENCOUNTER — Other Ambulatory Visit: Payer: Self-pay | Admitting: Internal Medicine

## 2019-08-19 ENCOUNTER — Other Ambulatory Visit (HOSPITAL_COMMUNITY): Payer: Self-pay | Admitting: Internal Medicine

## 2019-08-19 DIAGNOSIS — L989 Disorder of the skin and subcutaneous tissue, unspecified: Secondary | ICD-10-CM

## 2019-08-19 DIAGNOSIS — G629 Polyneuropathy, unspecified: Secondary | ICD-10-CM

## 2019-08-19 DIAGNOSIS — I739 Peripheral vascular disease, unspecified: Secondary | ICD-10-CM

## 2019-08-25 ENCOUNTER — Other Ambulatory Visit: Payer: Self-pay

## 2019-08-25 ENCOUNTER — Ambulatory Visit (HOSPITAL_COMMUNITY)
Admission: RE | Admit: 2019-08-25 | Discharge: 2019-08-25 | Disposition: A | Discharge status: Home / Self Care | Payer: Medicare Other | Source: Ambulatory Visit | Attending: Internal Medicine | Admitting: Internal Medicine

## 2019-08-25 DIAGNOSIS — G629 Polyneuropathy, unspecified: Secondary | ICD-10-CM | POA: Diagnosis present

## 2019-08-25 DIAGNOSIS — I739 Peripheral vascular disease, unspecified: Secondary | ICD-10-CM | POA: Diagnosis not present

## 2019-08-25 DIAGNOSIS — L989 Disorder of the skin and subcutaneous tissue, unspecified: Secondary | ICD-10-CM | POA: Diagnosis present

## 2019-08-30 ENCOUNTER — Other Ambulatory Visit (HOSPITAL_COMMUNITY): Payer: Self-pay | Admitting: Internal Medicine

## 2019-09-02 ENCOUNTER — Encounter: Payer: Self-pay | Admitting: Internal Medicine

## 2019-10-21 ENCOUNTER — Telehealth: Payer: Self-pay | Admitting: Dermatology

## 2019-10-21 NOTE — Telephone Encounter (Signed)
Patient called to let Dr Denna Haggard know how he was doing.  He left a message on voicemail saying that he was doing just fine.  Chart [phone]

## 2019-11-03 ENCOUNTER — Telehealth: Payer: Self-pay | Admitting: Dermatology

## 2019-11-03 NOTE — Telephone Encounter (Signed)
Informed Dr. Patrici Ranks office that Dr. Denna Haggard prefers patient to see Dr. Anne Fu and [facility] and they will do the referral.

## 2019-11-03 NOTE — Telephone Encounter (Signed)
Dr. Katy Fitch biopsied Mr. Meah right lower eyelid, showing BCC. This can be removed by Mohs surgery followed by oculoplastic or oculoplastic solo. He trusts [facility] oculoplasartic (Dr. Lorina Rabon?) and I trust [facility] Dr. Manley Mason. Will inform patient and arrange.

## 2019-11-03 NOTE — Telephone Encounter (Signed)
Phone call to patient and informed that Dr.Tafeen prefers Dr. Anne Fu at [facility] and that [place] office will arrange appointment

## 2019-11-16 DIAGNOSIS — H0289 Other specified disorders of eyelid: Secondary | ICD-10-CM | POA: Insufficient documentation

## 2020-01-19 ENCOUNTER — Encounter: Payer: Self-pay | Admitting: *Deleted

## 2020-01-20 ENCOUNTER — Ambulatory Visit (INDEPENDENT_AMBULATORY_CARE_PROVIDER_SITE_OTHER): Payer: Medicare Other | Admitting: Dermatology

## 2020-01-20 ENCOUNTER — Other Ambulatory Visit: Payer: Self-pay

## 2020-01-20 DIAGNOSIS — L570 Actinic keratosis: Secondary | ICD-10-CM

## 2020-01-20 DIAGNOSIS — Z85828 Personal history of other malignant neoplasm of skin: Secondary | ICD-10-CM

## 2020-01-20 DIAGNOSIS — D485 Neoplasm of uncertain behavior of skin: Secondary | ICD-10-CM | POA: Diagnosis not present

## 2020-01-20 NOTE — Patient Instructions (Addendum)
Biopsy, Surgery (Curettage) & Surgery (Excision) Aftercare Instructions  1. Okay to remove bandage in 24 hours  2. Wash area with soap and water  3. Apply Vaseline to area twice daily until healed (Not Neosporin)  4. Okay to cover with a Band-Aid to decrease the chance of infection or prevent irritation from clothing; also it's okay to uncover lesion at home.  5. Suture instructions: return to our office in 7-10 or 10-14 days for a nurse visit for suture removal. Variable healing with sutures, if pain or itching occurs call our office. It's okay to shower or bathe 24 hours after sutures are given.  6. The following risks may occur after a biopsy, curettage or excision: bleeding, scarring, discoloration, recurrence, infection (redness, yellow drainage, pain or swelling).  7. For questions, concerns and results call our office at [place] before 4pm & Friday before 3pm. Biopsy results will be available in 1 week.  Routine follow-up for Mr. Danny Molina date of birth Jan 31, 1935.  Danny Molina had a very positive experience in [place] with Mohs surgeon Anne Fu.plus oculoplastic Dr. Neldon Labella. His right lower eyelid is symmetrical with no ectropion and no tearing and no sign of residual skin cancer.  Dr. Manley Mason found a few other crusted areas which he wanted Danny Molina to point out to me.  Two moderately thick crusts on the left scalp may be superficial nonmelanoma skin cancers; shave biopsy done and I have asked Danny Molina to call my office on Monday to review the results.  A thinner crust below the right antihelix of his ear was treated as a precancer with 6 seconds of freezing.  This will swell and may peel.  If the freezing does not succeed we can  biopsy this in the future.

## 2020-01-24 ENCOUNTER — Telehealth: Payer: Self-pay | Admitting: Dermatology

## 2020-01-24 NOTE — Telephone Encounter (Signed)
Patient's wife is calling for pathology from visit last week with [name], MD.  (Chart # [phone])

## 2020-01-24 NOTE — Telephone Encounter (Signed)
Phone call to patient to inform him that we do not have any results back for him yet.  Patient aware.

## 2020-01-26 ENCOUNTER — Encounter (HOSPITAL_COMMUNITY): Payer: Self-pay | Admitting: Emergency Medicine

## 2020-01-26 ENCOUNTER — Emergency Department (HOSPITAL_COMMUNITY): Payer: Medicare Other

## 2020-01-26 ENCOUNTER — Emergency Department (HOSPITAL_COMMUNITY)
Admission: EM | Admit: 2020-01-26 | Discharge: 2020-01-26 | Discharge status: Against Medical Advice | Payer: Medicare Other | Attending: Emergency Medicine | Admitting: Emergency Medicine

## 2020-01-26 DIAGNOSIS — W19XXXA Unspecified fall, initial encounter: Secondary | ICD-10-CM

## 2020-01-26 DIAGNOSIS — T07XXXA Unspecified multiple injuries, initial encounter: Secondary | ICD-10-CM

## 2020-01-26 DIAGNOSIS — Y999 Unspecified external cause status: Secondary | ICD-10-CM | POA: Diagnosis not present

## 2020-01-26 DIAGNOSIS — Z85828 Personal history of other malignant neoplasm of skin: Secondary | ICD-10-CM | POA: Insufficient documentation

## 2020-01-26 DIAGNOSIS — E119 Type 2 diabetes mellitus without complications: Secondary | ICD-10-CM | POA: Diagnosis not present

## 2020-01-26 DIAGNOSIS — T148XXA Other injury of unspecified body region, initial encounter: Secondary | ICD-10-CM | POA: Insufficient documentation

## 2020-01-26 DIAGNOSIS — L03115 Cellulitis of right lower limb: Secondary | ICD-10-CM | POA: Diagnosis not present

## 2020-01-26 DIAGNOSIS — Y9389 Activity, other specified: Secondary | ICD-10-CM | POA: Diagnosis not present

## 2020-01-26 DIAGNOSIS — Z79899 Other long term (current) drug therapy: Secondary | ICD-10-CM | POA: Insufficient documentation

## 2020-01-26 DIAGNOSIS — S0003XA Contusion of scalp, initial encounter: Secondary | ICD-10-CM | POA: Insufficient documentation

## 2020-01-26 DIAGNOSIS — Z87891 Personal history of nicotine dependence: Secondary | ICD-10-CM | POA: Diagnosis not present

## 2020-01-26 DIAGNOSIS — S0990XA Unspecified injury of head, initial encounter: Secondary | ICD-10-CM | POA: Diagnosis present

## 2020-01-26 DIAGNOSIS — I10 Essential (primary) hypertension: Secondary | ICD-10-CM | POA: Insufficient documentation

## 2020-01-26 DIAGNOSIS — W109XXA Fall (on) (from) unspecified stairs and steps, initial encounter: Secondary | ICD-10-CM | POA: Diagnosis not present

## 2020-01-26 DIAGNOSIS — Z7984 Long term (current) use of oral hypoglycemic drugs: Secondary | ICD-10-CM | POA: Diagnosis not present

## 2020-01-26 DIAGNOSIS — Z7982 Long term (current) use of aspirin: Secondary | ICD-10-CM | POA: Diagnosis not present

## 2020-01-26 DIAGNOSIS — Y92009 Unspecified place in unspecified non-institutional (private) residence as the place of occurrence of the external cause: Secondary | ICD-10-CM | POA: Insufficient documentation

## 2020-01-26 LAB — CBC WITH DIFFERENTIAL/PLATELET
Abs Immature Granulocytes: 0.04 K/uL (ref 0.00–0.07)
Basophils Absolute: 0.1 K/uL (ref 0.0–0.1)
Basophils Relative: 1 %
Eosinophils Absolute: 0.1 K/uL (ref 0.0–0.5)
Eosinophils Relative: 1 %
HCT: 40.5 % (ref 39.0–52.0)
Hemoglobin: 13.6 g/dL (ref 13.0–17.0)
Immature Granulocytes: 0 %
Lymphocytes Relative: 15 %
Lymphs Abs: 1.6 K/uL (ref 0.7–4.0)
MCH: 31.9 pg (ref 26.0–34.0)
MCHC: 33.6 g/dL (ref 30.0–36.0)
MCV: 95.1 fL (ref 80.0–100.0)
Monocytes Absolute: 1.3 K/uL — ABNORMAL HIGH (ref 0.1–1.0)
Monocytes Relative: 13 %
Neutro Abs: 7.4 K/uL (ref 1.7–7.7)
Neutrophils Relative %: 70 %
Platelets: 163 K/uL (ref 150–400)
RBC: 4.26 MIL/uL (ref 4.22–5.81)
RDW: 13.5 % (ref 11.5–15.5)
WBC: 10.4 K/uL (ref 4.0–10.5)
nRBC: 0 % (ref 0.0–0.2)

## 2020-01-26 LAB — BASIC METABOLIC PANEL
Anion gap: 13 (ref 5–15)
BUN: 16 mg/dL (ref 8–23)
CO2: 20 mmol/L — ABNORMAL LOW (ref 22–32)
Calcium: 8.8 mg/dL — ABNORMAL LOW (ref 8.9–10.3)
Chloride: 101 mmol/L (ref 98–111)
Creatinine, Ser: 0.85 mg/dL (ref 0.61–1.24)
GFR calc Af Amer: >60 mL/min (ref >60)
GFR calc non Af Amer: >60 mL/min (ref >60)
Glucose, Bld: 112 mg/dL — ABNORMAL HIGH (ref 70–99)
Potassium: 4.1 mmol/L (ref 3.5–5.1)
Sodium: 134 mmol/L — ABNORMAL LOW (ref 135–145)

## 2020-01-26 MED ORDER — SULFAMETHOXAZOLE-TRIMETHOPRIM 800-160 MG PO TABS
1.0000 | ORAL_TABLET | Freq: Two times a day (BID) | ORAL | 0 refills | Status: AC
Start: 2020-01-26 — End: 2020-02-02

## 2020-01-26 NOTE — ED Triage Notes (Signed)
Pt reports he tripped and fell on Monday, hit his head on the car and busted the tail light. C/o R knee pain. Denies blood thinners other than aspirin. Pt a/ox4 resp e/u, nad.

## 2020-01-26 NOTE — Discharge Instructions (Signed)
You were seen today for evaluation after a fall. Your xray of your right knee showed some swelling but no signs of fracture or dislocations. You have wounds on your knees and the right knee looks light it may be getting infected so we are starting you on a antibiotic to be cautious. Your head Ct showed no signs of acute injury either. Please continue to rest and ice your knee and follow up with your primary care doctor for recheck in 2-3 days. Thank you for allowing me to care for you today. Please return to the emergency department if you have new or worsening symptoms. Take your medications as instructed.

## 2020-01-26 NOTE — ED Provider Notes (Signed)
[facility] EMERGENCY DEPARTMENT Provider Note   CSN: [id number] Arrival date & time: 01/26/20  4098     History Chief Complaint  Patient presents with  . Fall    Danny Molina is a 85 y.o. male.  Patient is an 85 year old gentleman with past medical history of hypertension, diabetes on aspirin presenting to the emergency department for knee pain and headache after fall.  Patient reports that on Monday he was walking down the stairs and had a mechanical fall forward where he hit his head on the tail end of his car causing the to light to break and then landing on his right and then left knee.  Reports he has been able to ambulate but his knee has gotten progressively stiffer and more swollen.  His headache has improved.  Denies any loss of consciousness, dizziness, numbness, tingling, weakness. He has abrasions to bilateral knees but reports he is UTD on tetanus        Past Medical History:  Diagnosis Date  . Arthritis   . BCC (basal cell carcinoma of skin) 05/20/2005   LEFT POST SHOULDER- TX CURET X 3,5FU  . BCC (basal cell carcinoma of skin) 12/02/2011   NOSE-TX MOHS  . BCC (basal cell carcinoma of skin) 07/15/2016   SUPERFICIAL- RIGHT UPPER BACK- TX CURET AFTER BIOPSY  . BCC (basal cell carcinoma of skin) 07/15/2016   SUPERFICIAL/ULCERATED RIGHT FOREARM- TX CURET AFRER BIOPSY  . BCC (basal cell carcinoma of skin) 07/06/2018   SUPERFICIAL,NODULAR, INFLITRATIVE- LEFT NOSE- TX MOHS  . Diabetes mellitus   . Fall down stairs Dec. 2014   At a friends home  . GERD (gastroesophageal reflux disease)   . Hypertension   . Malignant melanoma of left lower leg ([place]) 11/01/2015  . SCC (squamous cell carcinoma) 01/19/2018   RIGHT SIDEBURN- TX CURET X 3,EXCISION, 5FU  . SCC (squamous cell carcinoma) 01/19/2018   WELL DIFF-LEFT SUP CROWN SCALP- TX CURET AFTER BIOPSY  . SCCA (squamous cell carcinoma) of skin 11/22/2010   RIGHT SCALP POSTERIOR- TX CURET AFTER  BIOPSY  . SCCA (squamous cell carcinoma) of skin 11/22/2010   RIGHT SCALP MIDDLE- TX CURET AFTER BIOPSY  . SCCA (squamous cell carcinoma) of skin 11/22/2010   RIGHT SCALP ANTERIOR-TX CURET AFTER BIOPSY  . SCCA (squamous cell carcinoma) of skin 03/07/2011   RIGHT CROWN- TX CURET AFTER BIOPSY  . SCCA (squamous cell carcinoma) of skin 10/12/2012   RIGHT CROWN POST- TX CURET AFTER BIOPSY  . SCCA (squamous cell carcinoma) of skin 11/19/2012   CROWN- TX CURET AFTER BIOPSY  . SCCA (squamous cell carcinoma) of skin 12/15/2013   RIGHT FOREHEAD- TX CURET AFTER BIOPSY  . SCCA (squamous cell carcinoma) of skin 05/23/2015   LEFT SCALP- TX CURET AFTER BIOPSY  . SCCA (squamous cell carcinoma) of skin 05/23/2015   CENTER SCALP- TX CURET AFTER BIOPSY  . SCCA (squamous cell carcinoma) of skin 05/23/2015   MID CROWN SCALP- TX CURET AFTER BIOPSY  . SCCA (squamous cell carcinoma) of skin 05/23/2015   RIGHT CROWN MEDIAL- TX CURET AFTER BIOPSY  . SCCA (squamous cell carcinoma) of skin 05/23/2015   RIGHT CROWN LATERAL- TX CURET AFTER BIOPSY  . SCCA (squamous cell carcinoma) of skin 01/19/2018   RIGHT CROWN SUPERIOR- TX CURET AFTER BIOPSY  . SCCA (squamous cell carcinoma) of skin 01/19/2018   RIGHT CROWN INFERIOR- TX CURET AFTER BIOPSY  . SCCA (squamous cell carcinoma) of skin 07/06/2018   RIGHT FRONT SCALP INFERIOR- [place]  CURET AFTER BIOPSY  . SCCA (squamous cell carcinoma) of skin 07/06/2018   RIGHT FRONT SCALP, SUPERIOR- TX CURET AFTER BIOPSY  . SCCA (squamous cell carcinoma) of skin 07/06/2018   RIGHT MEDIAL SCALP- TX CURET AFTER BIOPSY  . SCCA (squamous cell carcinoma) of skin 07/06/2018   RIGHT POST SCALP- CURET AFTER BIOPSY  . SCCA (squamous cell carcinoma) of skin 08/27/2018   RIGHT CHEEK- TX CURET AFTER BIOPSY    Patient Active Problem List   Diagnosis Date Noted  . Aftercare following surgery of the circulatory system, [place] 01/24/2014  . Abdominal aneurysm without mention of rupture 01/13/2012     Past Surgical History:  Procedure Laterality Date  . ABDOMINAL AORTIC ANEURYSM REPAIR  09/21/2009  . LEG SURGERY Left        Family History  Problem Relation Age of Onset  . Heart disease Mother        Heart Disease before age 14  . Hypertension Mother   . Heart attack Mother   . Cancer Father   . Heart disease Father   . Heart attack Father   . Hypertension Sister   . Diabetes Sister   . Diabetes Brother   . Heart disease Brother   . Hypertension Brother   . Heart attack Brother   . Diabetes Brother     Social History   Tobacco Use  . Smoking status: Former Smoker    Types: Cigarettes    Quit date: 01/02/1993    Years since quitting: 27.0  . Smokeless tobacco: Never Used  Vaping Use  . Vaping Use: Never used  Substance Use Topics  . Alcohol use: Yes    Alcohol/week: 1.0 standard drink    Types: 1 Shots of liquor per week  . Drug use: No    Home Medications Prior to Admission medications   Medication Sig Start Date End Date Taking? Authorizing Provider  acitretin (SORIATANE) 10 MG capsule Take 10 mg by mouth daily before breakfast.    [provider]  amLODipine (NORVASC) 10 MG tablet Take 5 mg by mouth daily.    [provider]  aspirin 81 MG tablet Take 81 mg by mouth daily.    [provider]  chlorpheniramine (CHLOR-TRIMETON) 4 MG tablet Take 4 mg by mouth 2 (two) times daily as needed.    [provider]  diclofenac Sodium (VOLTAREN) 1 % GEL APPLY 4GM TOPICALLY 4 TIMES A DAY AS NEEDED FOR SHOULDER PAIN    [provider]  doxazosin (CARDURA) 8 MG tablet Take 4 mg by mouth at bedtime. 1/2 tab qd    [provider]  fish oil-omega-3 fatty acids 1000 MG capsule Take 1,200 g by mouth 2 (two) times daily.     [provider]  furosemide (LASIX) 20 MG tablet Take 20 mg by mouth daily. Reported on 09/04/2015    [provider]  losartan (COZAAR) 50 MG tablet Take by mouth.    [provider]  metFORMIN (GLUCOPHAGE) 500 MG tablet Take 500 mg by mouth 2 (two) times daily with a meal.    [provider]  metoprolol (TOPROL-XL) 200 MG 24 hr tablet Take 100 mg by mouth daily.     [provider]  Multiple Vitamin (MULTIVITAMIN) tablet Take 1 tablet by mouth daily.    [provider]  omeprazole (PRILOSEC) 20 MG capsule Take 20 mg by mouth daily.    [provider]  sulfamethoxazole-trimethoprim (BACTRIM DS) 800-160 MG tablet Take  1 tablet by mouth 2 (two) times daily for 7 days. 01/26/20 02/02/20  Madilyn Hook A, PA-C  valsartan (DIOVAN) 320 MG tablet Take 320 mg by mouth daily.    [provider]    Allergies    Penicillins and Diclofenac  Review of Systems   Review of Systems  Constitutional: Negative for appetite change, chills and fever.  Eyes: Negative.   Musculoskeletal: Positive for arthralgias, gait problem and joint swelling. Negative for back pain, myalgias, neck pain and neck stiffness.  Skin: Positive for wound.  Neurological: Positive for headaches. Negative for dizziness, syncope and weakness.  Psychiatric/Behavioral: Negative for confusion.  All other systems reviewed and are negative.   Physical Exam Updated Vital Signs BP (!) 154/62   Pulse (!) 56   Temp 98 F (36.7 C) (Oral)   Resp 16   Ht 5' 9.5" (1.765 m)   SpO2 95%   BMI 27.36 kg/m   Physical Exam Vitals and nursing note reviewed.  Constitutional:      General: He is not in acute distress.    Appearance: Normal appearance. He is not ill-appearing, toxic-appearing or diaphoretic.  HENT:     Head: Normocephalic and atraumatic.     Nose: Nose normal.     Mouth/Throat:     Mouth: Mucous membranes are moist.  Eyes:     Conjunctiva/sclera: Conjunctivae normal.     Pupils: Pupils are equal, round, and reactive to light.  Cardiovascular:     Rate and Rhythm: Normal rate.  Pulmonary:     Effort: Pulmonary effort is normal.     Breath  sounds: Normal breath sounds.  Abdominal:     General: Abdomen is flat.     Palpations: Abdomen is soft.  Musculoskeletal:     Comments: Swollen right knee with some decreased flexion. Diffusely tender. normal calf and ankle  Skin:    General: Skin is warm and dry.          Comments: Large abrasion across the right knee just below the patella. Small abrasion to the left anterior knee. Abrasion without drainage but there is erythema surrounding the abrasion about 2 cm circumferential .  Neurological:     General: No focal deficit present.     Mental Status: He is alert.  Psychiatric:        Mood and Affect: Mood normal.     ED Results / Procedures / Treatments   Labs (all labs ordered are listed, but only abnormal results are displayed) Labs Reviewed  CBC WITH DIFFERENTIAL/PLATELET - Abnormal; Notable for the following components:      Result Value   Monocytes Absolute 1.3 (*)    All other components within normal limits  BASIC METABOLIC PANEL - Abnormal; Notable for the following components:   Sodium 134 (*)    CO2 20 (*)    Glucose, Bld 112 (*)    Calcium 8.8 (*)    All other components within normal limits    EKG None  Radiology CT Head Wo Contrast  Result Date: 01/26/2020 CLINICAL DATA:  Status post fall. EXAM: CT HEAD WITHOUT CONTRAST TECHNIQUE: Contiguous axial images were obtained from the base of the skull through the vertex without intravenous contrast. COMPARISON:  None. FINDINGS: Brain: There is moderate severity cerebral atrophy with widening of the extra-axial spaces and ventricular dilatation. There are areas of decreased attenuation within the white matter tracts of the supratentorial brain, consistent with microvascular disease changes. This is most prominent within the posterior  parietal region on the left. Vascular: No hyperdense vessel or unexpected calcification. Skull: Normal. Negative for fracture or focal lesion. Sinuses/Orbits: No acute finding. Other:  None. IMPRESSION: 1. Generalized cerebral atrophy. 2. No acute intracranial abnormality. Electronically Signed   By: Virgina Norfolk M.D.   On: 01/26/2020 18:24   DG Knee Complete 4 Views Right  Result Date: 01/26/2020 CLINICAL DATA:  Pain and swelling. EXAM: RIGHT KNEE - COMPLETE 4+ VIEW COMPARISON:  None. FINDINGS: Bones are diffusely demineralized. No evidence for fracture. No subluxation or dislocation. Meniscal calcification evident in both the medial and lateral compartments. Loss of joint space with hypertrophic spurring evident in all 3 compartments and there is a moderate joint effusion in the suprapatellar bursa. IMPRESSION: 1. Tricompartmental degenerative changes with moderate joint effusion. 2. Chondrocalcinosis. This can be associated with calcium pyrophosphate deposition disease. Electronically Signed   By: Misty Stanley M.D.   On: 01/26/2020 10:45    Procedures Procedures (including critical care time)  Medications Ordered in ED Medications - No data to display  ED Course  I have reviewed the triage vital signs and the nursing notes.  Pertinent labs & imaging results that were available during my care of the patient were reviewed by me and considered in my medical decision making (see chart for details).  Clinical Course as of Jan 25 1830  Wed Jan 26, 2020  1743 Patient presenting for evaluation of knee pain and mild headache after fall 2 days ago. No neuro deficits. Fall was mechanical. Patient appears well. He did have significant abrasion to the right knee, also with some erythema surrounding but no abscess and no concern for septic joint. WBC count was normal and xray showed no acute bony injury. Patient is ambulatory. Concern for mild cellulitis so will start abx. Has reported anaphylaxis to penicillin.  Also awaiting head CT as patient still has mild headache and is taking aspirin.  He reports pain is controlled.   [KM]    Clinical Course User Index [KM] Kristine Royal   MDM Rules/Calculators/A&P                          Based on review of vitals, medical screening exam, lab work and/or imaging, there does not appear to be an acute, emergent etiology for the patient's symptoms. Counseled pt on good return precautions and encouraged both PCP and ED follow-up as needed.  Prior to discharge, I also discussed incidental imaging findings with patient in detail and advised appropriate, recommended follow-up in detail.  Clinical Impression: 1. Fall, initial encounter   2. Contusion of scalp, initial encounter   3. Multiple abrasions   4. Cellulitis of right lower extremity     Disposition: Discharge  Prior to providing a prescription for a controlled substance, I independently reviewed the patient's recent prescription history on the [place]. The patient had no recent or regular prescriptions and was deemed appropriate for a brief, less than 3 day prescription of narcotic for acute analgesia.  This note was prepared with assistance of Systems analyst. Occasional wrong-word or sound-a-like substitutions may have occurred due to the inherent limitations of voice recognition software.  Final Clinical Impression(s) / ED Diagnoses Final diagnoses:  Fall, initial encounter  Contusion of scalp, initial encounter  Multiple abrasions  Cellulitis of right lower extremity    Rx / DC Orders ED Discharge Orders  Ordered    sulfamethoxazole-trimethoprim (BACTRIM DS) 800-160 MG tablet  2 times daily     Discontinue  Reprint     01/26/20 1830           Kristine Royal 01/26/20 [place], Ankit, MD 01/26/20 2248

## 2020-01-27 ENCOUNTER — Telehealth: Payer: Self-pay | Admitting: *Deleted

## 2020-01-27 NOTE — Telephone Encounter (Signed)
-----   Message from [name], MD sent at 01/26/2020  3:18 PM EDT ----- Recheck in1-2 months. DrT

## 2020-01-27 NOTE — Telephone Encounter (Signed)
Path to patient. Patient will call for a follow up.

## 2020-03-04 ENCOUNTER — Encounter: Payer: Self-pay | Admitting: Dermatology

## 2020-03-04 NOTE — Progress Notes (Signed)
   Follow-Up Visit   Subjective  Danny Molina is a 85 y.o. male who presents for the following: Skin Problem (Check spot on scalp. Patient had [facility] in [place] on eye and the dr Shelda Jakes said we should check. ).  New crusts Location: Scalp and ear Duration:  Quality:  Associated Signs/Symptoms: Modifying Factors:  Severity:  Timing: Context: History of dozens of skin cancers  Objective  Well appearing patient in no apparent distress; mood and affect are within normal limits.  A focused examination was performed including Head and neck.. Relevant physical exam findings are noted in the Assessment and Plan.   Assessment & Plan    Neoplasm of uncertain behavior of skin (2) Mid Parietal Scalp frontal  Skin / nail biopsy Type of biopsy: tangential   Informed consent: discussed and consent obtained   Timeout: patient name, date of birth, surgical site, and procedure verified   Anesthesia: the lesion was anesthetized in a standard fashion   Anesthetic:  1% lidocaine w/ epinephrine 1-100,000 local infiltration Instrument used: flexible razor blade   Hemostasis achieved with: ferric subsulfate   Outcome: patient tolerated procedure well   Post-procedure details: wound care instructions given    Specimen 1 - Surgical pathology Differential Diagnosis: scc vs bcc Check Margins: No  Mid Parietal Scalp posterior  Skin / nail biopsy Type of biopsy: tangential   Informed consent: discussed and consent obtained   Timeout: patient name, date of birth, surgical site, and procedure verified   Procedure prep:  Patient was prepped and draped in usual sterile fashion Prep type:  Chlorhexidine Anesthesia: the lesion was anesthetized in a standard fashion   Anesthetic:  1% lidocaine w/ epinephrine 1-100,000 local infiltration Instrument used: flexible razor blade   Hemostasis achieved with: ferric subsulfate   Outcome: patient tolerated procedure well   Post-procedure details:  wound care instructions given    Specimen 2 - Surgical pathology Differential Diagnosis: scc vs bcc Check Margins: No  AK (actinic keratosis) Right Superior Crus of Antihelix  Destruction of lesion - Right Superior Crus of Antihelix Complexity: simple   Destruction method: cryotherapy   Informed consent: discussed and consent obtained   Lesion destroyed using liquid nitrogen: Yes   Cryotherapy cycles:  5 Outcome: patient tolerated procedure well with no complications   Post-procedure details: wound care instructions given       I, [name], MD, have reviewed all documentation for this visit.  The documentation on 03/04/20 for the exam, diagnosis, procedures, and orders are all accurate and complete.

## 2020-04-12 ENCOUNTER — Telehealth: Payer: Self-pay

## 2020-04-12 NOTE — Telephone Encounter (Signed)
Pt called with c/o R lower abdomen burning sensation x 3 days. He is s/p EVAR in 2011. He was unsure if he needed to move up his f/u appt that is scheduled for later this month. Per PA, since this isn't a typical symptom related to EVAR he is to keep his appt. He has been made aware he is to report to ED if he has worsening pain. Pt verbalized understanding.

## 2020-04-17 ENCOUNTER — Other Ambulatory Visit: Payer: Self-pay

## 2020-04-17 DIAGNOSIS — I739 Peripheral vascular disease, unspecified: Secondary | ICD-10-CM

## 2020-04-25 ENCOUNTER — Other Ambulatory Visit: Payer: Self-pay

## 2020-04-25 ENCOUNTER — Encounter: Payer: Self-pay | Admitting: Dermatology

## 2020-04-25 ENCOUNTER — Ambulatory Visit (INDEPENDENT_AMBULATORY_CARE_PROVIDER_SITE_OTHER): Payer: Medicare Other | Admitting: Dermatology

## 2020-04-25 DIAGNOSIS — L309 Dermatitis, unspecified: Secondary | ICD-10-CM | POA: Diagnosis not present

## 2020-04-25 DIAGNOSIS — Z1283 Encounter for screening for malignant neoplasm of skin: Secondary | ICD-10-CM | POA: Diagnosis not present

## 2020-04-25 DIAGNOSIS — Z85820 Personal history of malignant melanoma of skin: Secondary | ICD-10-CM | POA: Diagnosis not present

## 2020-04-25 DIAGNOSIS — D485 Neoplasm of uncertain behavior of skin: Secondary | ICD-10-CM

## 2020-04-25 DIAGNOSIS — L570 Actinic keratosis: Secondary | ICD-10-CM | POA: Diagnosis not present

## 2020-04-25 NOTE — Progress Notes (Signed)
ln2 scalp x 10 Right cheek x3

## 2020-04-25 NOTE — Patient Instructions (Addendum)
Follow-up visit for Danny Molina date of birth 1935/01/21.  He recently noted a pink crusting on the left proximal forearm which may be improving on its own.  There are several crusts on the left front crown of the scalp.  Examination of the left forearm showed a dermatitic spot which may have actually had a vesicle.  Samples of Halog ointment  provided which he will apply to the spot once daily for the next 10days.  If this clears it, there is no other evaluation needed.  As usual Mr. Caspers has half dozen crusts up on the scalp which are sun damage.  The 3 thickest ones had a shave biopsy done and he can call the office on Friday or next Monday to get that result.  Liquid nitrogen freeze done to the smaller spots; these may peel in the next 2 weeks.  There is no bandage or special care needed

## 2020-05-01 ENCOUNTER — Encounter (HOSPITAL_COMMUNITY): Payer: Self-pay

## 2020-05-01 ENCOUNTER — Other Ambulatory Visit: Payer: Self-pay

## 2020-05-01 ENCOUNTER — Ambulatory Visit (HOSPITAL_COMMUNITY)
Admission: RE | Admit: 2020-05-01 | Discharge: 2020-05-01 | Disposition: A | Discharge status: Home / Self Care | Payer: Medicare Other | Source: Ambulatory Visit | Attending: Surgery | Admitting: Surgery

## 2020-05-01 ENCOUNTER — Ambulatory Visit: Payer: Medicare Other | Admitting: Surgery

## 2020-05-01 DIAGNOSIS — I714 Abdominal aortic aneurysm, without rupture, unspecified: Secondary | ICD-10-CM

## 2020-05-01 DIAGNOSIS — I739 Peripheral vascular disease, unspecified: Secondary | ICD-10-CM

## 2020-05-02 ENCOUNTER — Telehealth: Payer: Self-pay | Admitting: Dermatology

## 2020-05-02 NOTE — Telephone Encounter (Signed)
Patient's wife calling for results

## 2020-05-02 NOTE — Telephone Encounter (Signed)
Path to patient November f/u made with Dr Denna Haggard

## 2020-05-08 ENCOUNTER — Ambulatory Visit (INDEPENDENT_AMBULATORY_CARE_PROVIDER_SITE_OTHER): Payer: Medicare Other | Admitting: Physician Assistant

## 2020-05-08 ENCOUNTER — Other Ambulatory Visit: Payer: Self-pay

## 2020-05-08 ENCOUNTER — Ambulatory Visit (HOSPITAL_COMMUNITY)
Admission: RE | Admit: 2020-05-08 | Discharge: 2020-05-08 | Disposition: A | Discharge status: Home / Self Care | Payer: Medicare Other | Source: Ambulatory Visit | Attending: Surgery | Admitting: Surgery

## 2020-05-08 VITALS — BP 110/60 | HR 61 | Temp 98.0°F | Resp 20 | Ht 69.5 in | Wt 186.5 lb

## 2020-05-08 DIAGNOSIS — I714 Abdominal aortic aneurysm, without rupture, unspecified: Secondary | ICD-10-CM

## 2020-05-08 NOTE — Progress Notes (Signed)
History of Present Illness:  Patient is a 85 y.o. year old male who presents for evaluation of symptomatic abdominal aortic aneurysm  s/p EVAR repair by Dr. Trula Slade 09/21/2009.  He is here for surveillance ultrasound follow up.    He denise abdominal pain andlumbar pain.  He states his walking a little more difficult lately due to DM peripheral neuropathy.  He is staying active as he tolerates.  He denise CP and SOB.  He takes a daily ASA and anti hypertensive's.  He does not take a Statin he states his cholesterol is good per his primary MD.     Past Medical History:  Diagnosis Date  . Arthritis   . BCC (basal cell carcinoma of skin) 05/20/2005   LEFT POST SHOULDER- TX CURET X 3,5FU  . BCC (basal cell carcinoma of skin) 12/02/2011   NOSE-TX MOHS  . BCC (basal cell carcinoma of skin) 07/15/2016   SUPERFICIAL- RIGHT UPPER BACK- TX CURET AFTER BIOPSY  . BCC (basal cell carcinoma of skin) 07/15/2016   SUPERFICIAL/ULCERATED RIGHT FOREARM- TX CURET AFRER BIOPSY  . BCC (basal cell carcinoma of skin) 07/06/2018   SUPERFICIAL,NODULAR, INFLITRATIVE- LEFT NOSE- TX MOHS  . Diabetes mellitus   . Fall down stairs Dec. 2014   At a friends home  . GERD (gastroesophageal reflux disease)   . Hypertension   . Malignant melanoma of left lower leg ([place]) 11/01/2015  . SCC (squamous cell carcinoma) 01/19/2018   RIGHT SIDEBURN- TX CURET X 3,EXCISION, 5FU  . SCC (squamous cell carcinoma) 01/19/2018   WELL DIFF-LEFT SUP CROWN SCALP- TX CURET AFTER BIOPSY  . SCCA (squamous cell carcinoma) of skin 11/22/2010   RIGHT SCALP POSTERIOR- TX CURET AFTER BIOPSY  . SCCA (squamous cell carcinoma) of skin 11/22/2010   RIGHT SCALP MIDDLE- TX CURET AFTER BIOPSY  . SCCA (squamous cell carcinoma) of skin 11/22/2010   RIGHT SCALP ANTERIOR-TX CURET AFTER BIOPSY  . SCCA (squamous cell carcinoma) of skin 03/07/2011   RIGHT CROWN- TX CURET AFTER BIOPSY  . SCCA (squamous cell carcinoma) of skin 10/12/2012   RIGHT CROWN  POST- TX CURET AFTER BIOPSY  . SCCA (squamous cell carcinoma) of skin 11/19/2012   CROWN- TX CURET AFTER BIOPSY  . SCCA (squamous cell carcinoma) of skin 12/15/2013   RIGHT FOREHEAD- TX CURET AFTER BIOPSY  . SCCA (squamous cell carcinoma) of skin 05/23/2015   LEFT SCALP- TX CURET AFTER BIOPSY  . SCCA (squamous cell carcinoma) of skin 05/23/2015   CENTER SCALP- TX CURET AFTER BIOPSY  . SCCA (squamous cell carcinoma) of skin 05/23/2015   MID CROWN SCALP- TX CURET AFTER BIOPSY  . SCCA (squamous cell carcinoma) of skin 05/23/2015   RIGHT CROWN MEDIAL- TX CURET AFTER BIOPSY  . SCCA (squamous cell carcinoma) of skin 05/23/2015   RIGHT CROWN LATERAL- TX CURET AFTER BIOPSY  . SCCA (squamous cell carcinoma) of skin 01/19/2018   RIGHT CROWN SUPERIOR- TX CURET AFTER BIOPSY  . SCCA (squamous cell carcinoma) of skin 01/19/2018   RIGHT CROWN INFERIOR- TX CURET AFTER BIOPSY  . SCCA (squamous cell carcinoma) of skin 07/06/2018   RIGHT FRONT SCALP INFERIOR- TX CURET AFTER BIOPSY  . SCCA (squamous cell carcinoma) of skin 07/06/2018   RIGHT FRONT SCALP, SUPERIOR- TX CURET AFTER BIOPSY  . SCCA (squamous cell carcinoma) of skin 07/06/2018   RIGHT MEDIAL SCALP- TX CURET AFTER BIOPSY  . SCCA (squamous cell carcinoma) of skin 07/06/2018   RIGHT POST SCALP- CURET AFTER BIOPSY  . SCCA (  squamous cell carcinoma) of skin 08/27/2018   RIGHT CHEEK- TX CURET AFTER BIOPSY    Past Surgical History:  Procedure Laterality Date  . ABDOMINAL AORTIC ANEURYSM REPAIR  09/21/2009  . LEG SURGERY Left      Social History Social History   Tobacco Use  . Smoking status: Former Smoker    Types: Cigarettes    Quit date: 01/02/1993    Years since quitting: 27.3  . Smokeless tobacco: Never Used  Vaping Use  . Vaping Use: Never used  Substance Use Topics  . Alcohol use: Yes    Alcohol/week: 1.0 standard drink    Types: 1 Shots of liquor per week  . Drug use: No    Family History Family History  Problem  Relation Age of Onset  . Heart disease Mother        Heart Disease before age 32  . Hypertension Mother   . Heart attack Mother   . Cancer Father   . Heart disease Father   . Heart attack Father   . Hypertension Sister   . Diabetes Sister   . Diabetes Brother   . Heart disease Brother   . Hypertension Brother   . Heart attack Brother   . Diabetes Brother     Allergies  Allergies  Allergen Reactions  . Penicillins Anaphylaxis and Rash  . Diclofenac Other (See Comments)     Current Outpatient Medications  Medication Sig Dispense Refill  . acitretin (SORIATANE) 10 MG capsule Take 10 mg by mouth daily before breakfast.    . amLODipine (NORVASC) 10 MG tablet Take 5 mg by mouth daily.    Marland Kitchen aspirin 81 MG tablet Take 81 mg by mouth daily.    . chlorpheniramine (CHLOR-TRIMETON) 4 MG tablet Take 4 mg by mouth 2 (two) times daily as needed.    . diclofenac Sodium (VOLTAREN) 1 % GEL APPLY 4GM TOPICALLY 4 TIMES A DAY AS NEEDED FOR SHOULDER PAIN    . doxazosin (CARDURA) 8 MG tablet Take 4 mg by mouth at bedtime. 1/2 tab qd    . fish oil-omega-3 fatty acids 1000 MG capsule Take 1,200 g by mouth 2 (two) times daily.     . furosemide (LASIX) 20 MG tablet Take 20 mg by mouth daily. Reported on 09/04/2015    . losartan (COZAAR) 50 MG tablet Take by mouth.    . metFORMIN (GLUCOPHAGE) 500 MG tablet Take 500 mg by mouth 2 (two) times daily with a meal.    . metoprolol (TOPROL-XL) 200 MG 24 hr tablet Take 100 mg by mouth daily.     . Multiple Vitamin (MULTIVITAMIN) tablet Take 1 tablet by mouth daily.    Marland Kitchen omeprazole (PRILOSEC) 20 MG capsule Take 20 mg by mouth daily.    . valsartan (DIOVAN) 320 MG tablet Take 320 mg by mouth daily.     No current facility-administered medications for this visit.    ROS:   General:  No weight loss, Fever, chills  HEENT: No recent headaches, no nasal bleeding, no visual changes, no sore throat  Neurologic: No dizziness, blackouts, seizures. No recent  symptoms of stroke or mini- stroke. No recent episodes of slurred speech, or temporary blindness.  Cardiac: No recent episodes of chest pain/pressure, no shortness of breath at rest.  No shortness of breath with exertion.  Denies history of atrial fibrillation or irregular heartbeat  Vascular: No history of rest pain in feet.  No history of claudication.  No history of non-healing ulcer,  No history of DVT   Pulmonary: No home oxygen, no productive cough, no hemoptysis,  No asthma or wheezing  Musculoskeletal:  [ ]  Arthritis, [ ]  Low back pain,  [x ] Joint pain  Hematologic:No history of hypercoagulable state.  No history of easy bleeding.  No history of anemia  Gastrointestinal: No hematochezia or melena,  No gastroesophageal reflux, no trouble swallowing  Urinary: [ ]  chronic Kidney disease, [ ]  on HD - [ ]  MWF or [ ]  TTHS, [ ]  Burning with urination, [ ]  Frequent urination, [ ]  Difficulty urinating;   Skin: No rashes  Psychological: No history of anxiety,  No history of depression   Physical Examination  Vitals:   05/08/20 1013  BP: 110/60  Pulse: 61  Resp: 20  Temp: 98 F (36.7 C)  TempSrc: Temporal  SpO2: 93%  Weight: 186 lb 8 oz (84.6 kg)  Height: 5' 9.5" (1.765 m)    Body mass index is 27.15 kg/m.  General:  Alert and oriented, no acute distress HEENT: Normal Neck: No bruit or JVD Pulmonary: Clear to auscultation bilaterally Cardiac: Regular Rate and Rhythm without murmur Gastrointestinal: Soft, non-tender, non-distended, no mass, no scars Skin: No rash Extremity Pulses:  2+ radial, brachial, femoral, not palpabledorsalis pedis, posterior tibial pulses bilaterally.  Feet are warm and well perfused without open wounds. Musculoskeletal: No deformity or edema  Neurologic: Upper and lower extremity motor 5/5 and symmetric  DATA:    Endovascular Aortic Repair (EVAR):  +----------+----------------+----------------+------------------+----------  ----+        Diameter AP (cm)Diameter Trans Velocities    Comments                   (cm)      (cm/sec)               +----------+----------------+----------------+------------------+----------  ----+  Aorta   4.66      5.10                         +----------+----------------+----------------+------------------+----------  ----+  Right Limb1.18      1.16      78                  +----------+----------------+----------------+------------------+----------  ----+  Left Limb                          not  visualized  +----------+----------------+----------------+------------------+----------  ----+         Summary:  Abdominal Aorta: Patent endovascular aneurysm repair with no evidence of  endoleak. The largest aortic diameter remains essentially unchanged  compared to prior exam. Previous diameter measurement was 5.0 cm obtained  on 05/12/2019.   ASSESSMENT:  symptomatic   AAA s/p EVAR repair by Dr. Trula Slade 2011 His AAA duplex is accentually unchanged without evidence of sac expansion.   He denise abdominal or lumbar pain and remains as active as possible.  He inspects his feet daily due to diabetic peripheral neuropathy.      PLAN:  Cont. ASA daily.  Maintian activity levels and f/u for repeat AAA duplex yearly.  He he developed sudden sever abdominal or lumbar pain he will call 911.   Roxy Horseman PA-C [facility] Office: [phone]  MD in clinic [place]

## 2020-05-25 ENCOUNTER — Encounter: Payer: Self-pay | Admitting: Dermatology

## 2020-05-25 MED ORDER — HALCINONIDE 0.1 % EX CREA: 1.0000 "application " | TOPICAL_CREAM | Freq: Every morning | CUTANEOUS | 0 refills | Status: AC

## 2020-05-25 NOTE — Progress Notes (Signed)
Follow-Up Visit   Subjective  Danny Molina is a 85 y.o. male who presents for the following: Follow-up (scalp crust and right cheek).  Skin exam. Location:  Duration:  Quality:  Associated Signs/Symptoms: Modifying Factors:  Severity:  Timing: Context:   Objective  Well appearing patient in no apparent distress; mood and affect are within normal limits.  All skin waist up examined.   Assessment & Plan    Follow-up visit for Danny Molina date of birth 02/04/1935.  He recently noted a pink crusting on the left proximal forearm which may be improving on its own.  There are several crusts on the left front crown of the scalp.  Examination of the left forearm showed a dermatitic spot which may have actually had a vesicle.  Samples of Halog ointment  provided which she will apply to the spot once daily for the next 10days.  If this clears it, there is no other evaluation needed.  As usual Danny Molina has half dozen crusts up on the scalp which are sun damage.  The 3 thickest ones had a shave biopsy done and he can call the office on Friday or next Monday to get that result.  Liquid nitrogen freeze done to the smaller spots; these may peel in the next 2 weeks.  There is no bandage or special care needed    Neoplasm of uncertain behavior of skin (3) right post Scalp  Skin / nail biopsy Type of biopsy: tangential   Informed consent: discussed and consent obtained   Timeout: patient name, date of birth, surgical site, and procedure verified   Procedure prep:  Patient was prepped and draped in usual sterile fashion (Non sterile) Prep type:  Chlorhexidine Anesthesia: the lesion was anesthetized in a standard fashion   Anesthetic:  1% lidocaine w/ epinephrine 1-100,000 local infiltration Instrument used: flexible razor blade   Outcome: patient tolerated procedure well   Post-procedure details: wound care instructions given    Specimen 1 - Surgical pathology Differential  Diagnosis: bcc scc Check Margins: No  left front scalp sup.  Skin / nail biopsy Type of biopsy: tangential   Informed consent: discussed and consent obtained   Timeout: patient name, date of birth, surgical site, and procedure verified   Procedure prep:  Patient was prepped and draped in usual sterile fashion (Non sterile) Prep type:  Chlorhexidine Anesthesia: the lesion was anesthetized in a standard fashion   Anesthetic:  1% lidocaine w/ epinephrine 1-100,000 local infiltration Instrument used: flexible razor blade   Outcome: patient tolerated procedure well   Post-procedure details: wound care instructions given    Specimen 2 - Surgical pathology Differential Diagnosis:bcc scc Check Margins: No  left front scalp inf  Skin / nail biopsy Type of biopsy: tangential   Informed consent: discussed and consent obtained   Timeout: patient name, date of birth, surgical site, and procedure verified   Procedure prep:  Patient was prepped and draped in usual sterile fashion (Non sterile) Prep type:  Chlorhexidine Anesthesia: the lesion was anesthetized in a standard fashion   Anesthetic:  1% lidocaine w/ epinephrine 1-100,000 local infiltration Instrument used: flexible razor blade   Outcome: patient tolerated procedure well   Post-procedure details: wound care instructions given    Specimen 3 - Surgical pathology Differential Diagnosis: bcc scc Check Margins: No  Yearly skin check  Dermatitis Left Forearm - Posterior  Halcinonide (HALOG) 0.1 % CREA - Left Forearm - Posterior  Encounter for screening for malignant  neoplasm of skin Mid Back  Yealry skin exam  AK (actinic keratosis) (3) Mid Parietal Scalp (2); Mid Occipital Scalp  Destruction of lesion - Mid Occipital Scalp, Mid Parietal Scalp (2) Complexity: simple   Destruction method: cryotherapy   Informed consent: discussed and consent obtained   Timeout:  patient name, date of birth, surgical site, and procedure  verified Lesion destroyed using liquid nitrogen: Yes   Outcome: patient tolerated procedure well with no complications    Personal history of malignant melanoma of skin Left Lower Leg - Posterior  Scar clear- yearly skin check     I, Danny Monarch, MD, have reviewed all documentation for this visit.  The documentation on 05/25/20 for the exam, diagnosis, procedures, and orders are all accurate and complete.

## 2020-05-28 ENCOUNTER — Encounter: Payer: Self-pay | Admitting: Dermatology

## 2020-07-04 ENCOUNTER — Ambulatory Visit (INDEPENDENT_AMBULATORY_CARE_PROVIDER_SITE_OTHER): Payer: Medicare Other | Admitting: Dermatology

## 2020-07-04 ENCOUNTER — Other Ambulatory Visit: Payer: Self-pay

## 2020-07-04 ENCOUNTER — Encounter: Payer: Self-pay | Admitting: Dermatology

## 2020-07-04 DIAGNOSIS — D692 Other nonthrombocytopenic purpura: Secondary | ICD-10-CM | POA: Diagnosis not present

## 2020-07-04 DIAGNOSIS — L821 Other seborrheic keratosis: Secondary | ICD-10-CM | POA: Diagnosis not present

## 2020-07-04 DIAGNOSIS — L570 Actinic keratosis: Secondary | ICD-10-CM

## 2020-07-04 DIAGNOSIS — Z1283 Encounter for screening for malignant neoplasm of skin: Secondary | ICD-10-CM

## 2020-07-06 ENCOUNTER — Encounter: Payer: Self-pay | Admitting: Dermatology

## 2020-07-06 NOTE — Progress Notes (Signed)
   Follow-Up Visit   Subjective  Danny Molina is a 85 y.o. male who presents for the following: Follow-up (Patient here today for follow up check previous LN2 spots and biopsy sites.  Per patient no new concerns.).  Check crusts on face and scalp. Location:  Duration:  Quality:  Associated Signs/Symptoms: Modifying Factors:  Severity:  Timing: Context:   Objective  Well appearing patient in no apparent distress; mood and affect are within normal limits.  A focused examination was performed including Head and neck.. Relevant physical exam findings are noted in the Assessment and Plan.   Assessment & Plan    Encounter for screening for malignant neoplasm of skin Scalp  AK (actinic keratosis) (9) Right Postauricular Area (2); Left Forehead (2); Scalp (5)  Destruction of lesion - Left Forehead, Right Postauricular Area, Scalp Complexity: simple   Destruction method: cryotherapy   Informed consent: discussed and consent obtained   Timeout:  patient name, date of birth, surgical site, and procedure verified Lesion destroyed using liquid nitrogen: Yes   Cryotherapy cycles:  3 Outcome: patient tolerated procedure well with no complications    Senile purpura (HCC) (2) Left Forearm - Posterior; Right Forearm - Posterior  Seborrheic keratosis (2) Left Temple; Right Temple  Leave if stable      I, [name], MD, have reviewed all documentation for this visit.  The documentation on 07/06/20 for the exam, diagnosis, procedures, and orders are all accurate and complete.

## 2020-08-17 DIAGNOSIS — I10 Essential (primary) hypertension: Secondary | ICD-10-CM | POA: Diagnosis not present

## 2020-08-17 DIAGNOSIS — Z0000 Encounter for general adult medical examination without abnormal findings: Secondary | ICD-10-CM | POA: Diagnosis not present

## 2020-08-17 DIAGNOSIS — E1169 Type 2 diabetes mellitus with other specified complication: Secondary | ICD-10-CM | POA: Diagnosis not present

## 2020-08-25 ENCOUNTER — Other Ambulatory Visit: Payer: Self-pay | Admitting: Internal Medicine

## 2020-08-25 DIAGNOSIS — R918 Other nonspecific abnormal finding of lung field: Secondary | ICD-10-CM

## 2020-09-11 ENCOUNTER — Other Ambulatory Visit: Payer: Medicare Other

## 2020-09-26 ENCOUNTER — Other Ambulatory Visit: Payer: Self-pay

## 2020-09-26 ENCOUNTER — Ambulatory Visit
Admission: RE | Admit: 2020-09-26 | Discharge: 2020-09-26 | Disposition: A | Discharge status: Home / Self Care | Payer: Medicare Other | Source: Ambulatory Visit | Attending: Internal Medicine | Admitting: Internal Medicine

## 2020-09-26 DIAGNOSIS — R918 Other nonspecific abnormal finding of lung field: Secondary | ICD-10-CM

## 2020-11-27 ENCOUNTER — Other Ambulatory Visit: Payer: Self-pay

## 2020-11-27 ENCOUNTER — Ambulatory Visit (INDEPENDENT_AMBULATORY_CARE_PROVIDER_SITE_OTHER): Payer: Medicare Other | Admitting: Dermatology

## 2020-11-27 DIAGNOSIS — L570 Actinic keratosis: Secondary | ICD-10-CM

## 2020-11-27 DIAGNOSIS — C4442 Squamous cell carcinoma of skin of scalp and neck: Secondary | ICD-10-CM | POA: Diagnosis not present

## 2020-11-27 DIAGNOSIS — D044 Carcinoma in situ of skin of scalp and neck: Secondary | ICD-10-CM

## 2020-11-27 DIAGNOSIS — D0439 Carcinoma in situ of skin of other parts of face: Secondary | ICD-10-CM

## 2020-11-27 DIAGNOSIS — D485 Neoplasm of uncertain behavior of skin: Secondary | ICD-10-CM

## 2020-11-27 NOTE — Patient Instructions (Signed)

## 2020-12-07 ENCOUNTER — Encounter: Payer: Self-pay | Admitting: Dermatology

## 2020-12-07 NOTE — Progress Notes (Signed)
Follow-Up Visit   Subjective  Danny Molina is a 86 y.o. male who presents for the following: Skin Problem (Has few spots to get checked. One on right side face and some spots on scalp. They are crusty. No bleeding. ).  Multiple new crusts Location:  Duration:  Quality:  Associated Signs/Symptoms: Modifying Factors:  Severity:  Timing: Context: History of multiple skin cancers  Objective  Well appearing patient in no apparent distress; mood and affect are within normal limits. Objective  Scalp: Multiple small pink crusts, 1 more hypertrophic lesions scalp which will be treated with freezing.  Objective  Right Preauricular Area: Waxy 6 mm crust     Objective  Right Buccal Cheek: Waxy 8 mm crust     Objective  Mid Parietal Scalp ant.: Waxy 10 mm crust     Objective  Mid Parietal Scalp post.: Volcano-like 1.3 cm nodule suggestive of KA         A focused examination was performed including Head and neck.. Relevant physical exam findings are noted in the Assessment and Plan.   Assessment & Plan    AK (actinic keratosis) Scalp  Destruction of lesion - Scalp Complexity: simple   Destruction method: cryotherapy   Informed consent: discussed and consent obtained   Timeout:  patient name, date of birth, surgical site, and procedure verified Lesion destroyed using liquid nitrogen: Yes   Cryotherapy cycles:  4 Outcome: patient tolerated procedure well with no complications    Neoplasm of uncertain behavior of skin (4) Right Preauricular Area  Skin / nail biopsy Type of biopsy: tangential   Informed consent: discussed and consent obtained   Timeout: patient name, date of birth, surgical site, and procedure verified   Anesthesia: the lesion was anesthetized in a standard fashion   Anesthetic:  1% lidocaine w/ epinephrine 1-100,000 local infiltration Instrument used: flexible razor blade   Hemostasis achieved with: aluminum chloride and  electrodesiccation   Outcome: patient tolerated procedure well   Post-procedure details: wound care instructions given    Specimen 1 - Surgical pathology Differential Diagnosis: bcc scc  Check Margins: No  Right Buccal Cheek  Skin / nail biopsy Type of biopsy: tangential   Informed consent: discussed and consent obtained   Timeout: patient name, date of birth, surgical site, and procedure verified   Anesthesia: the lesion was anesthetized in a standard fashion   Anesthetic:  1% lidocaine w/ epinephrine 1-100,000 local infiltration Instrument used: flexible razor blade   Hemostasis achieved with: aluminum chloride and electrodesiccation   Outcome: patient tolerated procedure well   Post-procedure details: wound care instructions given    Specimen 2 - Surgical pathology Differential Diagnosis: bcc scc  Check Margins: No  Mid Parietal Scalp ant.  Skin / nail biopsy Type of biopsy: tangential   Informed consent: discussed and consent obtained   Timeout: patient name, date of birth, surgical site, and procedure verified   Anesthesia: the lesion was anesthetized in a standard fashion   Anesthetic:  1% lidocaine w/ epinephrine 1-100,000 local infiltration Instrument used: flexible razor blade   Hemostasis achieved with: aluminum chloride and electrodesiccation   Outcome: patient tolerated procedure well   Post-procedure details: wound care instructions given    Specimen 3 - Surgical pathology Differential Diagnosis: bcc scc  Check Margins: No  Mid Parietal Scalp post.  Skin / nail biopsy Type of biopsy: tangential   Informed consent: discussed and consent obtained   Timeout: patient name, date of birth, surgical site, and procedure  verified   Anesthesia: the lesion was anesthetized in a standard fashion   Anesthetic:  1% lidocaine w/ epinephrine 1-100,000 local infiltration Instrument used: flexible razor blade   Hemostasis achieved with: aluminum chloride and  electrodesiccation   Outcome: patient tolerated procedure well   Post-procedure details: wound care instructions given    Specimen 4 - Surgical pathology Differential Diagnosis: bcc scc  Check Margins: No      I, [name], MD, have reviewed all documentation for this visit.  The documentation on 12/07/20 for the exam, diagnosis, procedures, and orders are all accurate and complete.

## 2020-12-11 ENCOUNTER — Telehealth: Payer: Self-pay

## 2020-12-11 NOTE — Telephone Encounter (Signed)
-----   Message from [name], MD sent at 12/05/2020  7:37 PM EDT ----- Please schedule as last surgical appointment of the morning or afternoon, we will decide at that time which I will do.

## 2020-12-11 NOTE — Telephone Encounter (Signed)
Phone call to patient with his pathology results. Patient aware of results.  

## 2021-01-18 ENCOUNTER — Encounter: Payer: Medicare Other | Admitting: Dermatology

## 2021-02-07 ENCOUNTER — Ambulatory Visit (INDEPENDENT_AMBULATORY_CARE_PROVIDER_SITE_OTHER): Payer: Medicare Other | Admitting: Dermatology

## 2021-02-07 ENCOUNTER — Other Ambulatory Visit: Payer: Self-pay

## 2021-02-07 ENCOUNTER — Encounter: Payer: Self-pay | Admitting: Dermatology

## 2021-02-07 DIAGNOSIS — C4492 Squamous cell carcinoma of skin, unspecified: Secondary | ICD-10-CM

## 2021-02-07 DIAGNOSIS — D0439 Carcinoma in situ of skin of other parts of face: Secondary | ICD-10-CM

## 2021-02-07 DIAGNOSIS — C4442 Squamous cell carcinoma of skin of scalp and neck: Secondary | ICD-10-CM

## 2021-02-07 DIAGNOSIS — D044 Carcinoma in situ of skin of scalp and neck: Secondary | ICD-10-CM | POA: Diagnosis not present

## 2021-02-07 NOTE — Patient Instructions (Signed)

## 2021-02-08 ENCOUNTER — Encounter: Payer: Medicare Other | Admitting: Dermatology

## 2021-02-19 ENCOUNTER — Other Ambulatory Visit: Payer: Self-pay | Admitting: Internal Medicine

## 2021-02-19 DIAGNOSIS — R918 Other nonspecific abnormal finding of lung field: Secondary | ICD-10-CM

## 2021-02-23 ENCOUNTER — Encounter: Payer: Self-pay | Admitting: Dermatology

## 2021-02-23 NOTE — Progress Notes (Signed)
   Follow-Up Visit   Subjective  Danny Molina is a 86 y.o. male who presents for the following: Procedure (Here for treatment- right preauricular area, right buccal cheek, mid parietal scalp ant, mid parietal scalp post- scc x 4 ).  Biopsy proven skin cancers Location:  Duration:  Quality:  Associated Signs/Symptoms: Modifying Factors:  Severity:  Timing: Context:   Objective  Well appearing patient in no apparent distress; mood and affect are within normal limits. Mid Parietal Scalp, Posterior Biopsy site identified by nurse and me  Mid Parietal Scalp, Anterior Biopsy site identified by nurse and me  Right Buccal Cheek Biopsy site identified by nurse and me    A focused examination was performed including head and neck. Relevant physical exam findings are noted in the Assessment and Plan.   Assessment & Plan    SCC (squamous cell carcinoma) Mid Parietal Scalp, Posterior  Destruction of lesion Complexity: simple   Destruction method: electrodesiccation and curettage   Informed consent: discussed and consent obtained   Timeout:  patient name, date of birth, surgical site, and procedure verified Anesthesia: the lesion was anesthetized in a standard fashion   Anesthetic:  1% lidocaine w/ epinephrine 1-100,000 local infiltration Curettage performed in three different directions: Yes   Curettage cycles:  3 Lesion length (cm):  1.5 Lesion width (cm):  1.5 Margin per side (cm):  0 Final wound size (cm):  1.5 Hemostasis achieved with:  ferric subsulfate Outcome: patient tolerated procedure well with no complications   Additional details:  Wound innoculated with 5 fluorouracil solution.  Carcinoma in situ of skin of scalp and neck Mid Parietal Scalp, Anterior  Destruction of lesion Complexity: simple   Destruction method: electrodesiccation and curettage   Informed consent: discussed and consent obtained   Timeout:  patient name, date of birth, surgical site,  and procedure verified Anesthesia: the lesion was anesthetized in a standard fashion   Anesthetic:  1% lidocaine w/ epinephrine 1-100,000 local infiltration Curettage performed in three different directions: Yes   Curettage cycles:  3 Lesion length (cm):  1.2 Lesion width (cm):  1.2 Margin per side (cm):  0 Final wound size (cm):  1.2 Hemostasis achieved with:  ferric subsulfate Outcome: patient tolerated procedure well with no complications   Additional details:  Wound innoculated with 5 fluorouracil solution.  Carcinoma in situ of skin of other part of face Right Buccal Cheek  Destruction of lesion - Right Buccal Cheek Complexity: simple   Destruction method: electrodesiccation and curettage   Informed consent: discussed and consent obtained   Timeout:  patient name, date of birth, surgical site, and procedure verified Anesthesia: the lesion was anesthetized in a standard fashion   Anesthetic:  1% lidocaine w/ epinephrine 1-100,000 local infiltration Curettage performed in three different directions: Yes   Electrodesiccation performed over the curetted area: Yes   Curettage cycles:  3 Lesion length (cm):  1.6 Lesion width (cm):  1.6 Margin per side (cm):  0 Final wound size (cm):  1.6 Hemostasis achieved with:  ferric subsulfate Outcome: patient tolerated procedure well with no complications   Additional details:  Wound innoculated with 5 fluorouracil solution.      I, [name], MD, have reviewed all documentation for this visit.  The documentation on 02/23/21 for the exam, diagnosis, procedures, and orders are all accurate and complete.

## 2021-03-20 ENCOUNTER — Other Ambulatory Visit: Payer: Self-pay

## 2021-03-20 DIAGNOSIS — I714 Abdominal aortic aneurysm, without rupture, unspecified: Secondary | ICD-10-CM

## 2021-04-02 ENCOUNTER — Encounter: Payer: Self-pay | Admitting: Dermatology

## 2021-04-02 ENCOUNTER — Other Ambulatory Visit: Payer: Self-pay

## 2021-04-02 ENCOUNTER — Ambulatory Visit (INDEPENDENT_AMBULATORY_CARE_PROVIDER_SITE_OTHER): Payer: Medicare Other | Admitting: Dermatology

## 2021-04-02 DIAGNOSIS — C4442 Squamous cell carcinoma of skin of scalp and neck: Secondary | ICD-10-CM

## 2021-04-02 DIAGNOSIS — C4492 Squamous cell carcinoma of skin, unspecified: Secondary | ICD-10-CM

## 2021-04-02 DIAGNOSIS — D485 Neoplasm of uncertain behavior of skin: Secondary | ICD-10-CM

## 2021-04-02 DIAGNOSIS — C44329 Squamous cell carcinoma of skin of other parts of face: Secondary | ICD-10-CM

## 2021-04-02 DIAGNOSIS — L570 Actinic keratosis: Secondary | ICD-10-CM

## 2021-04-02 NOTE — Patient Instructions (Signed)

## 2021-04-05 ENCOUNTER — Telehealth: Payer: Self-pay | Admitting: *Deleted

## 2021-04-05 NOTE — Telephone Encounter (Signed)
Pathology to patient-surgery appointment scheduled.  

## 2021-04-05 NOTE — Telephone Encounter (Signed)
-----   Message from [name], MD sent at 04/05/2021  6:37 AM EDT ----- Schedule surgery with Dr. Darene Lamer

## 2021-04-09 ENCOUNTER — Encounter: Payer: Self-pay | Admitting: Dermatology

## 2021-04-09 NOTE — Progress Notes (Signed)
   Follow-Up Visit   Subjective  Danny Molina is a 86 y.o. male who presents for the following: Procedure (Scc x1). Biopsy proven squamous cell carcinoma right sideburn, also check crusts on right temple and on cheek Location:  Duration:  Quality:  Associated Signs/Symptoms: Modifying Factors:  Severity:  Timing: Context:   Objective  Well appearing patient in no apparent distress; mood and affect are within normal limits. Left Malar Cheek 5 mm gritty pink crust  Right Preauricular Area Lesion identified by Dr.Teya Otterson and nurse in room.    Right Temporal Scalp 9 mm pink moderately thick crust right temple, rule out superficial carcinoma       A focused examination was performed including head and neck.. Relevant physical exam findings are noted in the Assessment and Plan.   Assessment & Plan    AK (actinic keratosis) Left Malar Cheek  Destruction of lesion - Left Malar Cheek Complexity: simple   Destruction method: cryotherapy   Informed consent: discussed and consent obtained   Timeout:  patient name, date of birth, surgical site, and procedure verified Lesion destroyed using liquid nitrogen: Yes   Cryotherapy cycles:  3 Outcome: patient tolerated procedure well with no complications   Post-procedure details: wound care instructions given    Squamous cell carcinoma of skin Right Preauricular Area  Destruction of lesion Complexity: simple   Destruction method: electrodesiccation and curettage   Informed consent: discussed and consent obtained   Timeout:  patient name, date of birth, surgical site, and procedure verified Anesthesia: the lesion was anesthetized in a standard fashion   Anesthetic:  1% lidocaine w/ epinephrine 1-100,000 local infiltration Curettage performed in three different directions: Yes   Electrodesiccation performed over the curetted area: Yes   Curettage cycles:  3 Lesion length (cm):  1.5 Lesion width (cm):  1.5 Margin per side  (cm):  0 Final wound size (cm):  1.5 Hemostasis achieved with:  ferric subsulfate Outcome: patient tolerated procedure well with no complications   Additional details:  Wound innoculated with 5 fluorouracil solution.  Neoplasm of uncertain behavior of skin Right Temporal Scalp  Skin / nail biopsy Type of biopsy: tangential   Informed consent: discussed and consent obtained   Timeout: patient name, date of birth, surgical site, and procedure verified   Anesthesia: the lesion was anesthetized in a standard fashion   Anesthetic:  1% lidocaine w/ epinephrine 1-100,000 local infiltration Instrument used: flexible razor blade   Hemostasis achieved with: ferric subsulfate   Outcome: patient tolerated procedure well   Post-procedure details: sterile dressing applied and wound care instructions given   Dressing type: bandage and petrolatum    Specimen 1 - Surgical pathology Differential Diagnosis: bcc scc  Check Margins: No      I, [name], MD, have reviewed all documentation for this visit.  The documentation on 04/09/21 for the exam, diagnosis, procedures, and orders are all accurate and complete.

## 2021-04-12 ENCOUNTER — Encounter: Payer: Medicare Other | Admitting: Dermatology

## 2021-05-14 ENCOUNTER — Other Ambulatory Visit (HOSPITAL_COMMUNITY): Payer: Medicare Other

## 2021-05-14 ENCOUNTER — Ambulatory Visit: Payer: Medicare Other

## 2021-05-23 ENCOUNTER — Other Ambulatory Visit: Payer: Self-pay

## 2021-05-23 ENCOUNTER — Ambulatory Visit (HOSPITAL_COMMUNITY)
Admission: RE | Admit: 2021-05-23 | Discharge: 2021-05-23 | Disposition: A | Discharge status: Home / Self Care | Payer: Medicare Other | Source: Ambulatory Visit | Attending: Surgery | Admitting: Surgery

## 2021-05-23 ENCOUNTER — Ambulatory Visit (INDEPENDENT_AMBULATORY_CARE_PROVIDER_SITE_OTHER): Payer: Medicare Other | Admitting: Physician Assistant

## 2021-05-23 VITALS — BP 151/66 | HR 51 | Temp 97.3°F | Resp 16 | Ht 69.5 in | Wt 183.0 lb

## 2021-05-23 DIAGNOSIS — I714 Abdominal aortic aneurysm, without rupture, unspecified: Secondary | ICD-10-CM | POA: Insufficient documentation

## 2021-05-23 NOTE — Progress Notes (Signed)
Office Note     CC:  follow up Requesting Provider:  Sueanne Margarita, DO  HPI: Danny Molina is a 86 y.o. (10/25/1934) male who presents for follow up of EVAR by Dr. Trula Slade 09/21/09. This was repaired due to symptomatic AAA.   He reports no significant back or abdominal pain. He says very occasionally he will have some back discomfort especially when its cold weather. He also has some cervical spine issues and is suppose to have some steroid injections soon. He denies any lower extremity claudication, rest pain or tissue loss. He does have neuropathy so he has unsteady balance as a result. This is unchanged.   The pt is not on a statin for cholesterol management.  The pt is on a daily aspirin.   Other AC:  none The pt is on ARB, Diuretic, BB for hypertension.   The pt is diabetic.   Tobacco hx:  Former, 1994  Past Medical History:  Diagnosis Date   Arthritis    BCC (basal cell carcinoma of skin) 05/20/2005   LEFT POST SHOULDER- TX CURET X 3,5FU   BCC (basal cell carcinoma of skin) 12/02/2011   NOSE-TX MOHS   BCC (basal cell carcinoma of skin) 07/15/2016   SUPERFICIAL- RIGHT UPPER BACK- TX CURET AFTER BIOPSY   BCC (basal cell carcinoma of skin) 07/15/2016   SUPERFICIAL/ULCERATED RIGHT FOREARM- TX CURET AFRER BIOPSY   BCC (basal cell carcinoma of skin) 07/06/2018   SUPERFICIAL,NODULAR, INFLITRATIVE- LEFT NOSE- [place] MOHS   Diabetes mellitus    Fall down stairs 07/2013   At a friends home   GERD (gastroesophageal reflux disease)    Hypertension    Malignant melanoma of left lower leg ([place]) 11/01/2015   outer left calf.    SCC (squamous cell carcinoma) 01/19/2018   RIGHT SIDEBURN- TX CURET X 3,EXCISION, 5FU   SCC (squamous cell carcinoma) 01/19/2018   WELL DIFF-LEFT SUP CROWN SCALP- TX CURET AFTER BIOPSY   SCC (squamous cell carcinoma) 11/27/2020   SCC (squamous cell carcinoma) 11/27/2020   mid parietal scalp ant (CX35FU)   SCC (squamous cell carcinoma) 11/27/2020   mid  parietal scalp post (CX35FU)   SCCA (squamous cell carcinoma) of skin 11/22/2010   RIGHT SCALP POSTERIOR- TX CURET AFTER BIOPSY   SCCA (squamous cell carcinoma) of skin 11/22/2010   RIGHT SCALP MIDDLE- TX CURET AFTER BIOPSY   SCCA (squamous cell carcinoma) of skin 11/22/2010   RIGHT SCALP ANTERIOR-TX CURET AFTER BIOPSY   SCCA (squamous cell carcinoma) of skin 03/07/2011   RIGHT CROWN- TX CURET AFTER BIOPSY   SCCA (squamous cell carcinoma) of skin 10/12/2012   RIGHT CROWN POST- TX CURET AFTER BIOPSY   SCCA (squamous cell carcinoma) of skin 11/19/2012   CROWN- TX CURET AFTER BIOPSY   SCCA (squamous cell carcinoma) of skin 12/15/2013   RIGHT FOREHEAD- TX CURET AFTER BIOPSY   SCCA (squamous cell carcinoma) of skin 05/23/2015   LEFT SCALP- TX CURET AFTER BIOPSY   SCCA (squamous cell carcinoma) of skin 05/23/2015   CENTER SCALP- TX CURET AFTER BIOPSY   SCCA (squamous cell carcinoma) of skin 05/23/2015   MID CROWN SCALP- TX CURET AFTER BIOPSY   SCCA (squamous cell carcinoma) of skin 05/23/2015   RIGHT CROWN MEDIAL- TX CURET AFTER BIOPSY   SCCA (squamous cell carcinoma) of skin 05/23/2015   RIGHT CROWN LATERAL- TX CURET AFTER BIOPSY   SCCA (squamous cell carcinoma) of skin 01/19/2018   RIGHT CROWN SUPERIOR- TX CURET AFTER BIOPSY   SCCA (  squamous cell carcinoma) of skin 01/19/2018   RIGHT CROWN INFERIOR- TX CURET AFTER BIOPSY   SCCA (squamous cell carcinoma) of skin 07/06/2018   RIGHT FRONT SCALP INFERIOR- TX CURET AFTER BIOPSY   SCCA (squamous cell carcinoma) of skin 07/06/2018   RIGHT FRONT SCALP, SUPERIOR- TX CURET AFTER BIOPSY   SCCA (squamous cell carcinoma) of skin 07/06/2018   RIGHT MEDIAL SCALP- TX CURET AFTER BIOPSY   SCCA (squamous cell carcinoma) of skin 07/06/2018   RIGHT POST SCALP- CURET AFTER BIOPSY   SCCA (squamous cell carcinoma) of skin 08/27/2018   RIGHT CHEEK- TX CURET AFTER BIOPSY    Past Surgical History:  Procedure Laterality Date   ABDOMINAL AORTIC ANEURYSM  REPAIR  09/21/2009   LEG SURGERY Left     Social History   Socioeconomic History   Marital status: Married    Spouse name: Not on file   Number of children: Not on file   Years of education: Not on file   Highest education level: Not on file  Occupational History   Not on file  Tobacco Use   Smoking status: Former    Types: Cigarettes    Quit date: 01/02/1993    Years since quitting: 28.4   Smokeless tobacco: Never  Vaping Use   Vaping Use: Never used  Substance and Sexual Activity   Alcohol use: Yes    Alcohol/week: 1.0 standard drink    Types: 1 Shots of liquor per week   Drug use: No   Sexual activity: Not on file  Other Topics Concern   Not on file  Social History Narrative   Not on file   Social Determinants of Health   Financial Resource Strain: Not on file  Food Insecurity: Not on file  Transportation Needs: Not on file  Physical Activity: Not on file  Stress: Not on file  Social Connections: Not on file  Intimate Partner Violence: Not on file    Family History  Problem Relation Age of Onset   Heart disease Mother        Heart Disease before age 74   Hypertension Mother    Heart attack Mother    Cancer Father    Heart disease Father    Heart attack Father    Hypertension Sister    Diabetes Sister    Diabetes Brother    Heart disease Brother    Hypertension Brother    Heart attack Brother    Diabetes Brother     Current Outpatient Medications  Medication Sig Dispense Refill   acitretin (SORIATANE) 10 MG capsule Take 10 mg by mouth daily before breakfast.     amLODipine (NORVASC) 10 MG tablet Take 5 mg by mouth daily.     aspirin 81 MG tablet Take 81 mg by mouth daily.     chlorpheniramine (CHLOR-TRIMETON) 4 MG tablet Take 4 mg by mouth 2 (two) times daily as needed.     diclofenac Sodium (VOLTAREN) 1 % GEL APPLY 4GM TOPICALLY 4 TIMES A DAY AS NEEDED FOR SHOULDER PAIN     doxazosin (CARDURA) 8 MG tablet Take 4 mg by mouth at bedtime. 1/2 tab qd      fish oil-omega-3 fatty acids 1000 MG capsule Take 1,200 g by mouth 2 (two) times daily.      furosemide (LASIX) 20 MG tablet Take 20 mg by mouth daily. Reported on 09/04/2015     gabapentin (NEURONTIN) 300 MG capsule Take 600 mg by mouth at bedtime.  Halcinonide (HALOG) 0.1 % CREA Apply 1 application topically every morning. 60 g 0   losartan (COZAAR) 50 MG tablet Take by mouth.     metFORMIN (GLUCOPHAGE) 500 MG tablet Take 500 mg by mouth 2 (two) times daily with a meal.     metoprolol (TOPROL-XL) 200 MG 24 hr tablet Take 100 mg by mouth daily.      Multiple Vitamin (MULTIVITAMIN) tablet Take 1 tablet by mouth daily.     omeprazole (PRILOSEC) 20 MG capsule Take 20 mg by mouth daily.     valsartan (DIOVAN) 320 MG tablet Take 320 mg by mouth daily.     No current facility-administered medications for this visit.    Allergies  Allergen Reactions   Penicillins Anaphylaxis and Rash   Diclofenac Other (See Comments)     REVIEW OF SYSTEMS:   [X]  denotes positive finding, [ ]  denotes negative finding Cardiac  Comments:  Chest pain or chest pressure:    Shortness of breath upon exertion:    Short of breath when lying flat:    Irregular heart rhythm:        Vascular    Pain in calf, thigh, or hip brought on by ambulation:    Pain in feet at night that wakes you up from your sleep:     Blood clot in your veins:    Leg swelling:         Pulmonary    Oxygen at home:    Productive cough:     Wheezing:         Neurologic    Sudden weakness in arms or legs:     Sudden numbness in arms or legs:     Sudden onset of difficulty speaking or slurred speech:    Temporary loss of vision in one eye:     Problems with dizziness:         Gastrointestinal    Blood in stool:     Vomited blood:         Genitourinary    Burning when urinating:     Blood in urine:        Psychiatric    Major depression:         Hematologic    Bleeding problems:    Problems with blood clotting too  easily:        Skin    Rashes or ulcers:        Constitutional    Fever or chills:      PHYSICAL EXAMINATION:  Vitals:   05/23/21 0920  BP: (!) 151/66  Pulse: (!) 51  Resp: 16  Temp: (!) 97.3 F (36.3 C)  TempSrc: Oral  SpO2: 96%  Weight: 183 lb (83 kg)  Height: 5' 9.5" (1.765 m)    General:  WDWN in NAD; vital signs documented above Gait: Normal HENT: WNL, normocephalic Pulmonary: normal non-labored breathing , without wheezing Cardiac: regular HR, without  Murmurs without carotid bruit Abdomen: soft, NT, no masses.  Vascular Exam/Pulses:  Right Left  Radial 2+ (normal) 2+ (normal)  Femoral 2+ (normal) 2+ (normal)  Popliteal Not palpable Not palpable  DP Not palpable Not palpable  PT Not palpable Not palpable   Extremities: without ischemic changes, without Gangrene , without cellulitis; without open wounds;  Musculoskeletal: no muscle wasting or atrophy  Neurologic: A&O X 3;  No focal weakness or paresthesias are detected Psychiatric:  The pt has Normal affect.   Non-Invasive Vascular Imaging:   Endovascular Aortic  Repair (EVAR):  +----------+----------------+-------------------+-------------------+            Diameter AP (cm)Diameter Trans (cm)Velocities (cm/sec)  +----------+----------------+-------------------+-------------------+  Aorta     5.10            5.06               68                   +----------+----------------+-------------------+-------------------+  Right Limb1.36            1.40               108                  +----------+----------------+-------------------+-------------------+  Left Limb 1.58            1.50               80                   +----------+----------------+-------------------+-------------------+   Summary:  Abdominal Aorta: Patent endovascular aneurysm repair with no evidence of endoleak. The largest aortic diameter remains essentially unchanged compared to prior exam. Previous diameter  measurement was 5.10 cm obtained on 05/08/2020.   ASSESSMENT/PLAN:: 86 y.o. male here for follow up of EVAR by Dr. Trula Slade in 2011 He does not have any abdominal or back pain. EVAR duplex today shows stable AAA without endoleak - Continue Aspirin - He knows that if he has any severe abdominal or back pain that he needs to call 911 or go to the ER - He will follow up in 1 year with repeat EVAR   Karoline Caldwell, PA-C [facility] [phone]  Clinic MD:   [facility]/ [facility]

## 2021-05-31 ENCOUNTER — Encounter: Payer: Medicare Other | Admitting: Dermatology

## 2021-07-04 ENCOUNTER — Ambulatory Visit: Payer: Medicare Other | Admitting: Dermatology

## 2021-07-12 ENCOUNTER — Ambulatory Visit (INDEPENDENT_AMBULATORY_CARE_PROVIDER_SITE_OTHER): Payer: Medicare Other | Admitting: Dermatology

## 2021-07-12 ENCOUNTER — Other Ambulatory Visit: Payer: Self-pay

## 2021-07-12 ENCOUNTER — Encounter: Payer: Self-pay | Admitting: Dermatology

## 2021-07-12 DIAGNOSIS — D044 Carcinoma in situ of skin of scalp and neck: Secondary | ICD-10-CM

## 2021-07-12 DIAGNOSIS — D099 Carcinoma in situ, unspecified: Secondary | ICD-10-CM

## 2021-07-12 DIAGNOSIS — D485 Neoplasm of uncertain behavior of skin: Secondary | ICD-10-CM

## 2021-07-12 NOTE — Patient Instructions (Signed)

## 2021-07-30 ENCOUNTER — Encounter: Payer: Self-pay | Admitting: Dermatology

## 2021-07-30 NOTE — Progress Notes (Signed)
Follow-Up Visit   Subjective  Danny Molina is a 86 y.o. male who presents for the following: Procedure (Scc right temple).  Biopsy proven carcinoma in situ right scalp plus similar growths that have grown on the ground. Location:  Duration:  Quality:  Associated Signs/Symptoms: Modifying Factors:  Severity:  Timing: Context:   Objective  Well appearing patient in no apparent distress; mood and affect are within normal limits. Right Temporal Scalp Lesion identified by Dr.Azlan Hanway and nurse in room.    Mid Post crown 2 adjacent 1.5 cm and 3.5cm crusts across posterior crown, probable superficial carcinoma.     sup crown       A focused examination was performed including head and neck.. Relevant physical exam findings are noted in the Assessment and Plan.   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  Destruction of lesion Complexity: simple   Destruction method: electrodesiccation and curettage   Informed consent: discussed and consent obtained   Timeout:  patient name, date of birth, surgical site, and procedure verified Anesthesia: the lesion was anesthetized in a standard fashion   Anesthetic:  1% lidocaine w/ epinephrine 1-100,000 local infiltration Curettage performed in three different directions: Yes   Curettage cycles:  3 Lesion length (cm):  2 Lesion width (cm):  2 Margin per side (cm):  0 Final wound size (cm):  2 Hemostasis achieved with:  ferric subsulfate Outcome: patient tolerated procedure well with no complications   Additional details:  Wound innoculated with 5 fluorouracil solution.  Carcinoma in situ of skin of scalp and neck (2) Mid Post crown  Skin / nail biopsy Type of biopsy: tangential   Informed consent: discussed and consent obtained   Timeout: patient name, date of birth, surgical site, and procedure verified   Procedure prep:  Patient was prepped and draped in usual sterile fashion (Non  sterile) Prep type:  Chlorhexidine Anesthesia: the lesion was anesthetized in a standard fashion   Anesthetic:  1% lidocaine w/ epinephrine 1-100,000 local infiltration Instrument used: flexible razor blade   Outcome: patient tolerated procedure well   Post-procedure details: wound care instructions given    Destruction of lesion Complexity: simple   Destruction method: electrodesiccation and curettage   Informed consent: discussed and consent obtained   Timeout:  patient name, date of birth, surgical site, and procedure verified Anesthesia: the lesion was anesthetized in a standard fashion   Anesthetic:  1% lidocaine w/ epinephrine 1-100,000 local infiltration Curettage performed in three different directions: Yes   Curettage cycles:  3 Lesion length (cm):  1.7 Lesion width (cm):  1.7 Margin per side (cm):  0 Final wound size (cm):  1.7 Hemostasis achieved with:  ferric subsulfate Outcome: patient tolerated procedure well with no complications   Additional details:  Wound innoculated with 5 fluorouracil solution.  Specimen 1 - Surgical pathology Differential Diagnosis: bcc scc tx with bx  Check Margins: No  sup crown  Skin / nail biopsy Type of biopsy: tangential   Informed consent: discussed and consent obtained   Timeout: patient name, date of birth, surgical site, and procedure verified   Procedure prep:  Patient was prepped and draped in usual sterile fashion (Non sterile) Prep type:  Chlorhexidine Anesthesia: the lesion was anesthetized in a standard fashion   Anesthetic:  1% lidocaine w/ epinephrine 1-100,000 local infiltration Instrument used: flexible razor blade   Outcome: patient tolerated procedure well   Post-procedure details: wound care instructions given  Destruction of lesion Complexity: simple   Destruction method: electrodesiccation and curettage   Informed consent: discussed and consent obtained   Timeout:  patient name, date of birth, surgical  site, and procedure verified Anesthesia: the lesion was anesthetized in a standard fashion   Anesthetic:  1% lidocaine w/ epinephrine 1-100,000 local infiltration Curettage performed in three different directions: Yes   Curettage cycles:  3 Lesion length (cm):  3.5 Lesion width (cm):  3.5 Margin per side (cm):  0 Final wound size (cm):  3.5 Hemostasis achieved with:  ferric subsulfate Outcome: patient tolerated procedure well with no complications   Additional details:  Wound innoculated with 5 fluorouracil solution.  Specimen 2 - Surgical pathology Differential Diagnosis: bcc scc tx with bx  Check Margins: No  After shave biopsy each lesion was treated by curettage plus inoculation base with parenteral fluorouracil.      I, [name], MD, have reviewed all documentation for this visit.  The documentation on 07/30/21 for the exam, diagnosis, procedures, and orders are all accurate and complete.

## 2021-09-28 ENCOUNTER — Other Ambulatory Visit: Payer: Medicare Other

## 2021-10-03 IMAGING — CR DG KNEE COMPLETE 4+V*R*
4 series · 4 of 4 positions shown · non-contrast
Comparison: None.

CLINICAL DATA: Pain and swelling.

EXAM:
RIGHT KNEE - COMPLETE 4+ VIEW

[knee ap (1 of 2)]
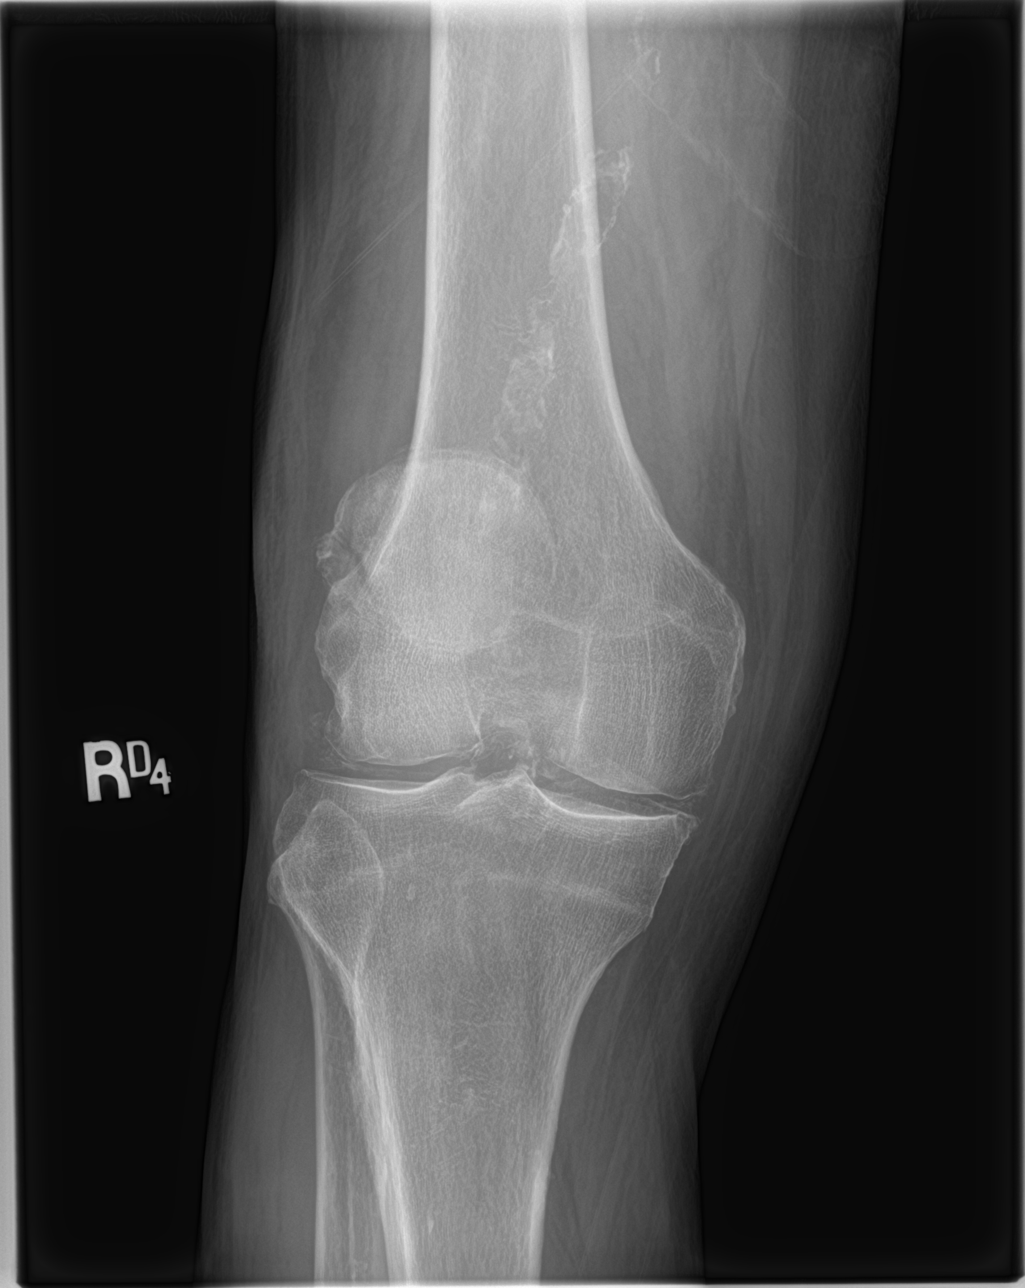

[knee lat]
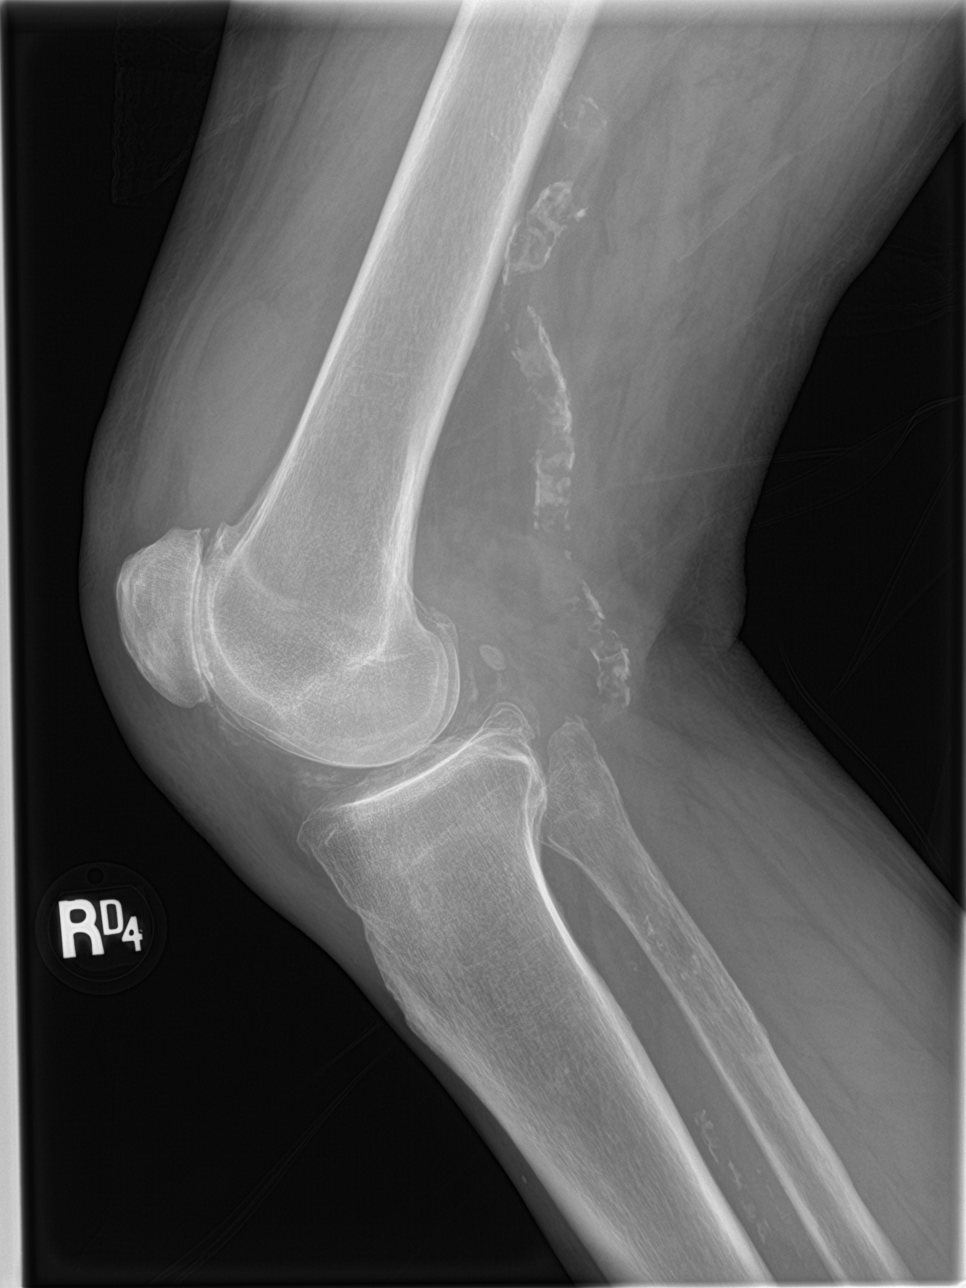

[knee obl]
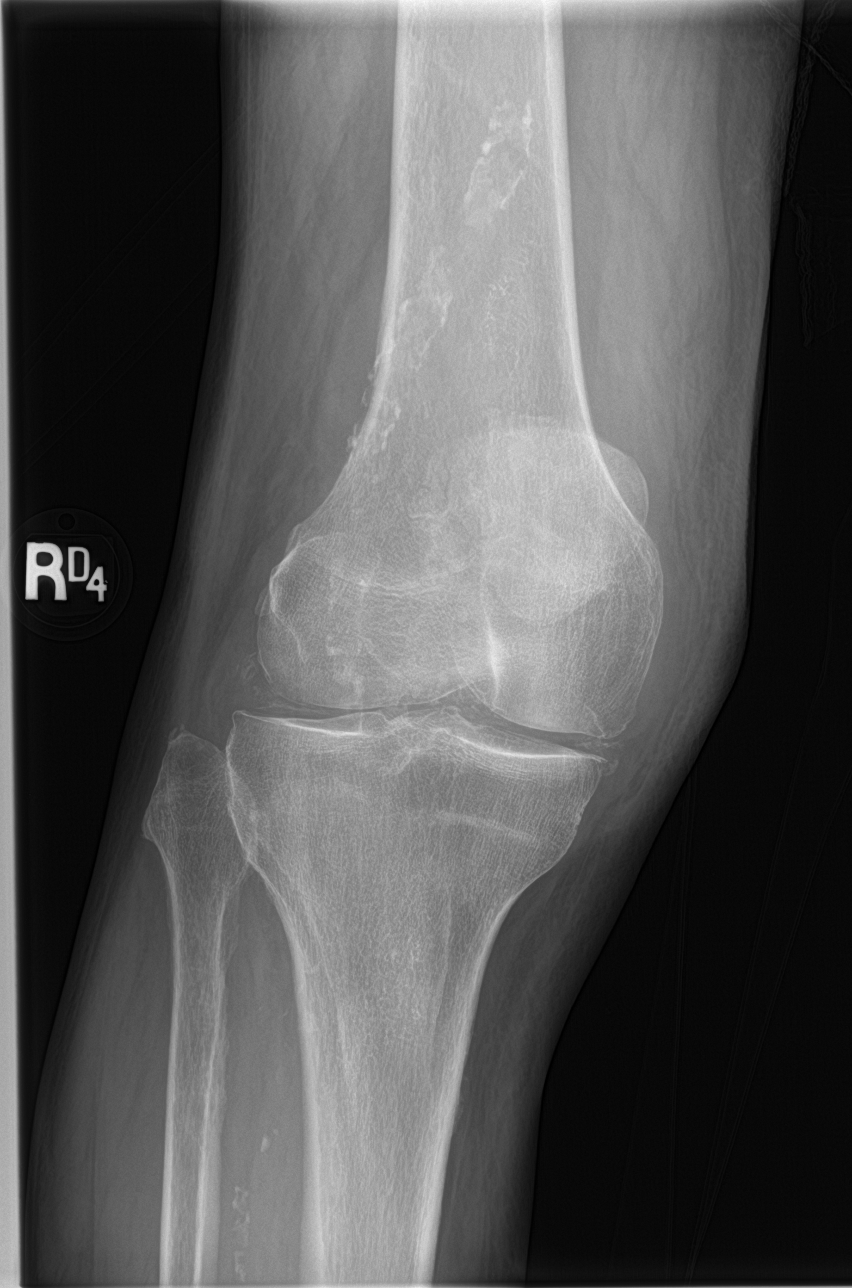

[knee ap (2 of 2)]
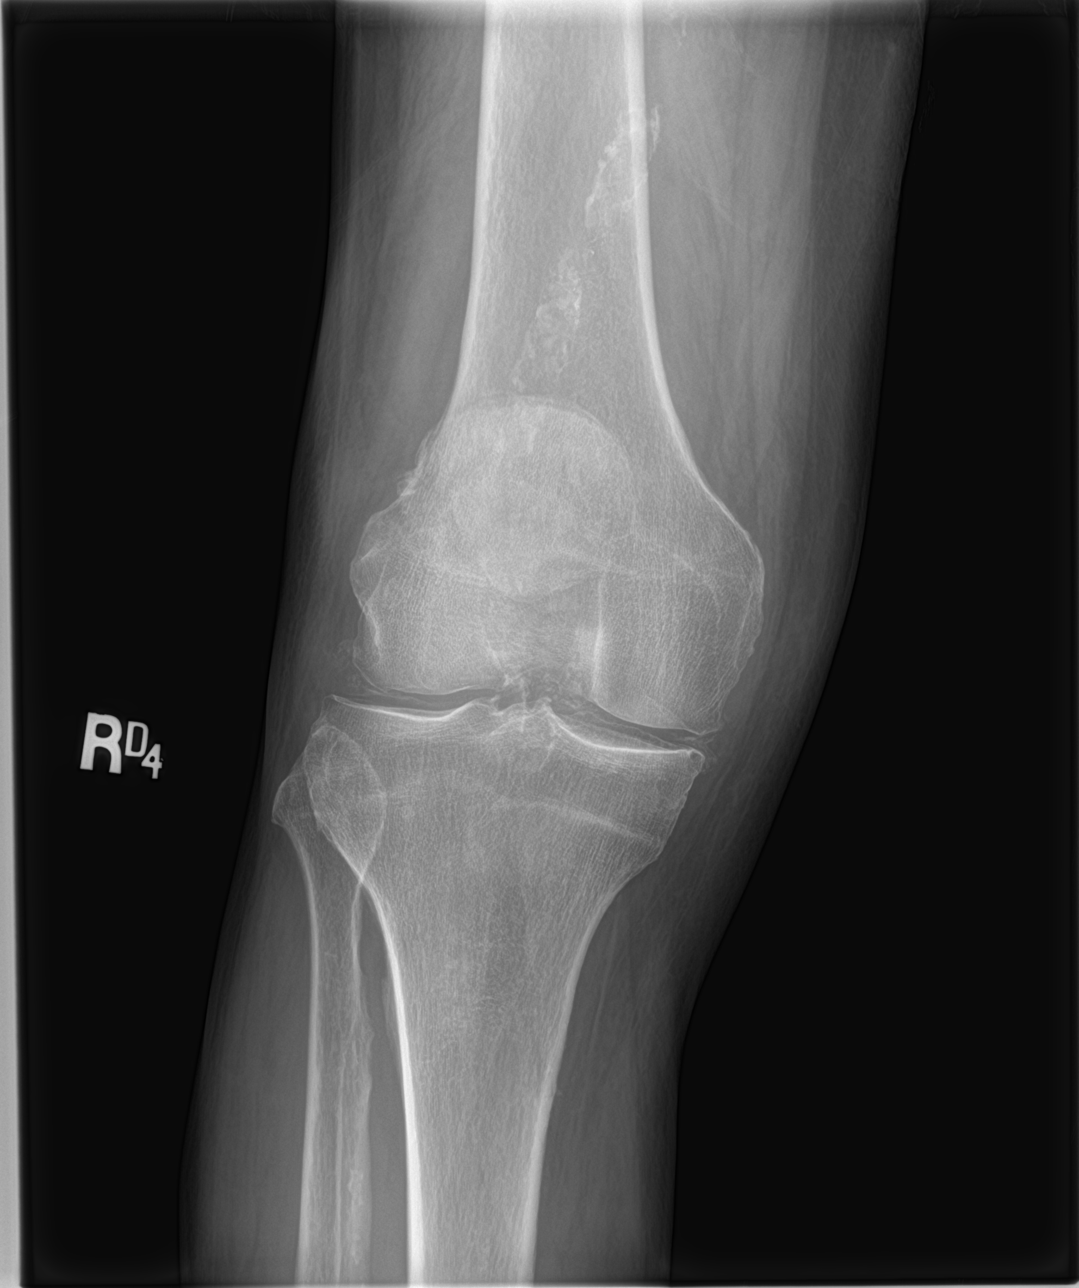

[4 of 4 positions shown; findings below may reference images not displayed]

FINDINGS: Bones are diffusely demineralized. No evidence for fracture. No
subluxation or dislocation. Meniscal calcification evident in both
the medial and lateral compartments. Loss of joint space with
hypertrophic spurring evident in all 3 compartments and there is a
moderate joint effusion in the suprapatellar bursa.
IMPRESSION: 1. Tricompartmental degenerative changes with moderate joint
effusion.
2. Chondrocalcinosis. This can be associated with calcium
pyrophosphate deposition disease.

## 2021-10-22 ENCOUNTER — Ambulatory Visit
Admission: RE | Admit: 2021-10-22 | Discharge: 2021-10-22 | Disposition: A | Discharge status: Home / Self Care | Payer: Medicare Other | Source: Ambulatory Visit | Attending: Internal Medicine | Admitting: Internal Medicine

## 2021-10-22 DIAGNOSIS — R918 Other nonspecific abnormal finding of lung field: Secondary | ICD-10-CM

## 2021-10-24 ENCOUNTER — Telehealth: Payer: Self-pay

## 2021-10-24 ENCOUNTER — Other Ambulatory Visit: Payer: Self-pay

## 2021-10-24 DIAGNOSIS — I714 Abdominal aortic aneurysm, without rupture, unspecified: Secondary | ICD-10-CM

## 2021-10-24 DIAGNOSIS — I739 Peripheral vascular disease, unspecified: Secondary | ICD-10-CM

## 2021-10-24 NOTE — Telephone Encounter (Signed)
Pt's PCP called to get pt further studies due to pt c/o back pain and 3 cm common iliac artery stenosis per Dr. Maebelle Munroe. Dr. Marcha Dutton to send over his office visit notes and imaging performed at his office. Pt aware of appts/date and times. ?

## 2021-10-25 ENCOUNTER — Other Ambulatory Visit: Payer: Self-pay | Admitting: Vascular Surgery

## 2021-10-25 DIAGNOSIS — Z9889 Other specified postprocedural states: Secondary | ICD-10-CM

## 2021-10-26 ENCOUNTER — Other Ambulatory Visit: Payer: Self-pay

## 2021-10-26 ENCOUNTER — Ambulatory Visit (HOSPITAL_COMMUNITY)
Admission: RE | Admit: 2021-10-26 | Discharge: 2021-10-26 | Disposition: A | Discharge status: Home / Self Care | Payer: Medicare Other | Source: Ambulatory Visit | Attending: Physician Assistant | Admitting: Physician Assistant

## 2021-10-26 ENCOUNTER — Ambulatory Visit (INDEPENDENT_AMBULATORY_CARE_PROVIDER_SITE_OTHER)
Admission: RE | Admit: 2021-10-26 | Discharge: 2021-10-26 | Disposition: A | Discharge status: Home / Self Care | Payer: Medicare Other | Source: Ambulatory Visit | Attending: Physician Assistant | Admitting: Physician Assistant

## 2021-10-26 DIAGNOSIS — I714 Abdominal aortic aneurysm, without rupture, unspecified: Secondary | ICD-10-CM

## 2021-10-26 DIAGNOSIS — Z8679 Personal history of other diseases of the circulatory system: Secondary | ICD-10-CM | POA: Diagnosis not present

## 2021-10-26 DIAGNOSIS — I739 Peripheral vascular disease, unspecified: Secondary | ICD-10-CM

## 2021-10-26 DIAGNOSIS — Z9889 Other specified postprocedural states: Secondary | ICD-10-CM

## 2021-10-30 ENCOUNTER — Ambulatory Visit: Payer: Medicare Other

## 2021-11-01 ENCOUNTER — Ambulatory Visit (INDEPENDENT_AMBULATORY_CARE_PROVIDER_SITE_OTHER): Payer: Medicare Other | Admitting: Physician Assistant

## 2021-11-01 ENCOUNTER — Encounter: Payer: Self-pay | Admitting: Physician Assistant

## 2021-11-01 VITALS — BP 148/72 | HR 56 | Temp 97.5°F | Resp 22 | Ht 69.0 in | Wt 180.0 lb

## 2021-11-01 DIAGNOSIS — I714 Abdominal aortic aneurysm, without rupture, unspecified: Secondary | ICD-10-CM

## 2021-11-01 NOTE — Progress Notes (Signed)
 Office Note     CC:  follow up Requesting Provider:  Valentin Skates, DO  HPI: Danny Molina is a 87 y.o. (January 14, 1935) male who presents for evaluation of back pain.  Patient has worried this may be related to his aneurysm.  He underwent EVAR by Dr. Serene on 09/21/2009.  By the time of his appointment, patient states the back pain has resolved.  He has had chronic lower back pain over the past 40 years.  He denies any claudication, rest pain, or tissue changes of bilateral lower extremities.  He continues to take an aspirin daily.  He denies tobacco use.   Past Medical History:  Diagnosis Date   Arthritis    BCC (basal cell carcinoma of skin) 05/20/2005   LEFT POST SHOULDER- TX CURET X 3,5FU   BCC (basal cell carcinoma of skin) 12/02/2011   NOSE-TX MOHS   BCC (basal cell carcinoma of skin) 07/15/2016   SUPERFICIAL- RIGHT UPPER BACK- TX CURET AFTER BIOPSY   BCC (basal cell carcinoma of skin) 07/15/2016   SUPERFICIAL/ULCERATED RIGHT FOREARM- TX CURET AFRER BIOPSY   BCC (basal cell carcinoma of skin) 07/06/2018   SUPERFICIAL,NODULAR, INFLITRATIVE- LEFT NOSE- TX MOHS   Diabetes mellitus    Fall down stairs 07/2013   At a friends home   GERD (gastroesophageal reflux disease)    Hypertension    Malignant melanoma of left lower leg (HCC) 11/01/2015   outer left calf. tx excision   SCC (squamous cell carcinoma) 01/19/2018   RIGHT SIDEBURN- TX CURET X 3,EXCISION, 5FU   SCC (squamous cell carcinoma) 01/19/2018   WELL DIFF-LEFT SUP CROWN SCALP- TX CURET AFTER BIOPSY   SCC (squamous cell carcinoma) 11/27/2020   SCC (squamous cell carcinoma) 11/27/2020   mid parietal scalp ant (CX35FU)   SCC (squamous cell carcinoma) 11/27/2020   mid parietal scalp post (CX35FU)   SCCA (squamous cell carcinoma) of skin 11/22/2010   RIGHT SCALP POSTERIOR- TX CURET AFTER BIOPSY   SCCA (squamous cell carcinoma) of skin 11/22/2010   RIGHT SCALP MIDDLE- TX CURET AFTER BIOPSY   SCCA (squamous cell  carcinoma) of skin 11/22/2010   RIGHT SCALP ANTERIOR-TX CURET AFTER BIOPSY   SCCA (squamous cell carcinoma) of skin 03/07/2011   RIGHT CROWN- TX CURET AFTER BIOPSY   SCCA (squamous cell carcinoma) of skin 10/12/2012   RIGHT CROWN POST- TX CURET AFTER BIOPSY   SCCA (squamous cell carcinoma) of skin 11/19/2012   CROWN- TX CURET AFTER BIOPSY   SCCA (squamous cell carcinoma) of skin 12/15/2013   RIGHT FOREHEAD- TX CURET AFTER BIOPSY   SCCA (squamous cell carcinoma) of skin 05/23/2015   LEFT SCALP- TX CURET AFTER BIOPSY   SCCA (squamous cell carcinoma) of skin 05/23/2015   CENTER SCALP- TX CURET AFTER BIOPSY   SCCA (squamous cell carcinoma) of skin 05/23/2015   MID CROWN SCALP- TX CURET AFTER BIOPSY   SCCA (squamous cell carcinoma) of skin 05/23/2015   RIGHT CROWN MEDIAL- TX CURET AFTER BIOPSY   SCCA (squamous cell carcinoma) of skin 05/23/2015   RIGHT CROWN LATERAL- TX CURET AFTER BIOPSY   SCCA (squamous cell carcinoma) of skin 01/19/2018   RIGHT CROWN SUPERIOR- TX CURET AFTER BIOPSY   SCCA (squamous cell carcinoma) of skin 01/19/2018   RIGHT CROWN INFERIOR- TX CURET AFTER BIOPSY   SCCA (squamous cell carcinoma) of skin 07/06/2018   RIGHT FRONT SCALP INFERIOR- TX CURET AFTER BIOPSY   SCCA (squamous cell carcinoma) of skin 07/06/2018   RIGHT FRONT SCALP, SUPERIOR- TX  CURET AFTER BIOPSY   SCCA (squamous cell carcinoma) of skin 07/06/2018   RIGHT MEDIAL SCALP- TX CURET AFTER BIOPSY   SCCA (squamous cell carcinoma) of skin 07/06/2018   RIGHT POST SCALP- CURET AFTER BIOPSY   SCCA (squamous cell carcinoma) of skin 08/27/2018   RIGHT CHEEK- TX CURET AFTER BIOPSY    Past Surgical History:  Procedure Laterality Date   ABDOMINAL AORTIC ANEURYSM REPAIR  09/21/2009   LEG SURGERY Left     Social History   Socioeconomic History   Marital status: Married    Spouse name: Not on file   Number of children: Not on file   Years of education: Not on file   Highest education level: Not on file   Occupational History   Not on file  Tobacco Use   Smoking status: Former    Types: Cigarettes    Quit date: 01/02/1993    Years since quitting: 28.8   Smokeless tobacco: Never  Vaping Use   Vaping Use: Never used  Substance and Sexual Activity   Alcohol use: Yes    Alcohol/week: 1.0 standard drink    Types: 1 Shots of liquor per week   Drug use: No   Sexual activity: Not on file  Other Topics Concern   Not on file  Social History Narrative   Not on file   Social Determinants of Health   Financial Resource Strain: Not on file  Food Insecurity: Not on file  Transportation Needs: Not on file  Physical Activity: Not on file  Stress: Not on file  Social Connections: Not on file  Intimate Partner Violence: Not on file    Family History  Problem Relation Age of Onset   Heart disease Mother        Heart Disease before age 8   Hypertension Mother    Heart attack Mother    Cancer Father    Heart disease Father    Heart attack Father    Hypertension Sister    Diabetes Sister    Diabetes Brother    Heart disease Brother    Hypertension Brother    Heart attack Brother    Diabetes Brother     Current Outpatient Medications  Medication Sig Dispense Refill   acitretin (SORIATANE) 10 MG capsule Take 10 mg by mouth daily before breakfast.     amLODipine (NORVASC) 10 MG tablet Take 5 mg by mouth daily.     aspirin 81 MG tablet Take 81 mg by mouth daily.     chlorpheniramine (CHLOR-TRIMETON) 4 MG tablet Take 4 mg by mouth 2 (two) times daily as needed.     diclofenac Sodium (VOLTAREN) 1 % GEL APPLY 4GM TOPICALLY 4 TIMES A DAY AS NEEDED FOR SHOULDER PAIN     doxazosin (CARDURA) 8 MG tablet Take 4 mg by mouth at bedtime. 1/2 tab qd     fish oil-omega-3 fatty acids 1000 MG capsule Take 1,200 g by mouth 2 (two) times daily.      furosemide (LASIX) 20 MG tablet Take 20 mg by mouth daily. Reported on 09/04/2015     gabapentin (NEURONTIN) 300 MG capsule Take 600 mg by mouth at  bedtime.     Halcinonide  (HALOG ) 0.1 % CREA Apply 1 application topically every morning. 60 g 0   losartan (COZAAR) 50 MG tablet Take by mouth.     metFORMIN (GLUCOPHAGE) 500 MG tablet Take 500 mg by mouth 2 (two) times daily with a meal.     metoprolol (  TOPROL-XL) 200 MG 24 hr tablet Take 100 mg by mouth daily.      Multiple Vitamin (MULTIVITAMIN) tablet Take 1 tablet by mouth daily.     omeprazole (PRILOSEC) 20 MG capsule Take 20 mg by mouth daily.     valsartan (DIOVAN) 320 MG tablet Take 320 mg by mouth daily.     No current facility-administered medications for this visit.    Allergies  Allergen Reactions   Penicillins Anaphylaxis and Rash   Diclofenac Other (See Comments)     REVIEW OF SYSTEMS:   [X]  denotes positive finding, [ ]  denotes negative finding Cardiac  Comments:  Chest pain or chest pressure:    Shortness of breath upon exertion:    Short of breath when lying flat:    Irregular heart rhythm:        Vascular    Pain in calf, thigh, or hip brought on by ambulation:    Pain in feet at night that wakes you up from your sleep:     Blood clot in your veins:    Leg swelling:         Pulmonary    Oxygen at home:    Productive cough:     Wheezing:         Neurologic    Sudden weakness in arms or legs:     Sudden numbness in arms or legs:     Sudden onset of difficulty speaking or slurred speech:    Temporary loss of vision in one eye:     Problems with dizziness:         Gastrointestinal    Blood in stool:     Vomited blood:         Genitourinary    Burning when urinating:     Blood in urine:        Psychiatric    Major depression:         Hematologic    Bleeding problems:    Problems with blood clotting too easily:        Skin    Rashes or ulcers:        Constitutional    Fever or chills:      PHYSICAL EXAMINATION:  Vitals:   11/01/21 0855  BP: (!) 148/72  Pulse: (!) 56  Resp: (!) 22  Temp: (!) 97.5 F (36.4 C)  TempSrc: Temporal   SpO2: 96%  Weight: 180 lb (81.6 kg)  Height: 5' 9 (1.753 m)    General:  WDWN in NAD; vital signs documented above Gait: Not observed HENT: WNL, normocephalic Pulmonary: normal non-labored breathing , without Rales, rhonchi,  wheezing Cardiac: regular HR Abdomen: soft, NT, no masses Skin: without rashes Vascular Exam/Pulses:  Right Left  Radial 2+ (normal) 2+ (normal)  DP 2+ (normal) 2+ (normal)  PT 2+ (normal) 2+ (normal)   Extremities: without ischemic changes, without Gangrene , without cellulitis; without open wounds;  Musculoskeletal: no muscle wasting or atrophy  Neurologic: A&O X 3;  No focal weakness or paresthesias are detected Psychiatric:  The pt has Normal affect.   Non-Invasive Vascular Imaging:   Ivar duplex demonstrates a widely patent stent graft.   AAA sac size unchanged 5.1 cm Bilateral ABIs within normal limits with triphasic signals at the ankle   ASSESSMENT/PLAN:: 87 y.o. male here for surveillance of EVAR with recent episode of back pain  -Subjectively back pain has nearly completely resolved by the time of office visits -EVAR duplex demonstrates a widely  patent stent graft and stable AAA sac size without any endoleaks -ABIs are also within normal limits and patient has palpable pedal pulses bilaterally -We will recheck EVAR duplex in 1 year per protocol -Patient will call/return office sooner with any questions or concerns   Donnice Sender, PA-C [facility] [phone]  Clinic MD:   Serene

## 2021-11-12 ENCOUNTER — Ambulatory Visit (INDEPENDENT_AMBULATORY_CARE_PROVIDER_SITE_OTHER): Payer: Medicare Other | Admitting: Dermatology

## 2021-11-12 ENCOUNTER — Encounter: Payer: Self-pay | Admitting: Dermatology

## 2021-11-12 DIAGNOSIS — D485 Neoplasm of uncertain behavior of skin: Secondary | ICD-10-CM

## 2021-11-12 DIAGNOSIS — L570 Actinic keratosis: Secondary | ICD-10-CM | POA: Diagnosis not present

## 2021-11-12 DIAGNOSIS — D044 Carcinoma in situ of skin of scalp and neck: Secondary | ICD-10-CM | POA: Diagnosis not present

## 2021-11-12 NOTE — Patient Instructions (Signed)

## 2021-11-30 ENCOUNTER — Encounter: Payer: Self-pay | Admitting: Dermatology

## 2021-11-30 NOTE — Progress Notes (Signed)
 Follow-Up Visit   Subjective  Danny Molina is a 87 y.o. male who presents for the following: Follow-up (Right temporal scalp & mid parietal scalp- healing good).  Multiple crusts on scalp Location:  Duration:  Quality:  Associated Signs/Symptoms: Modifying Factors:  Severity:  Timing: Context:   Objective  Well appearing patient in no apparent distress; mood and affect are within normal limits. Inferior Mid Frontal Scalp 1.3 cm impetiginized crust       Right Preauricular Area, Scalp (7) Dozen 2 to 5 mm gritty and hornlike pink crusts  Left Mid Parietal Scalp 1.3 cm waxy crust       Mid Parietal Scalp 1.3 cm waxy crust       Lateral Mid Frontal Scalp 1.4 cm eroded crust         A focused examination was performed including head and neck. Relevant physical exam findings are noted in the Assessment and Plan.   Assessment & Plan    Neoplasm of uncertain behavior of skin Inferior Mid Frontal Scalp  Skin / nail biopsy Type of biopsy: tangential   Informed consent: discussed and consent obtained   Timeout: patient name, date of birth, surgical site, and procedure verified   Procedure prep:  Patient was prepped and draped in usual sterile fashion (Non sterile) Prep type:  Chlorhexidine Anesthesia: the lesion was anesthetized in a standard fashion   Anesthetic:  1% lidocaine  w/ epinephrine  1-100,000 local infiltration Instrument used: flexible razor blade   Hemostasis achieved with: ferric subsulfate and electrodesiccation   Outcome: patient tolerated procedure well   Post-procedure details: sterile dressing applied and wound care instructions given   Dressing type: bandage and petrolatum    Destruction of lesion Complexity: simple   Destruction method: electrodesiccation and curettage   Informed consent: discussed and consent obtained   Timeout:  patient name, date of birth, surgical site, and procedure verified Anesthesia: the lesion  was anesthetized in a standard fashion   Anesthetic:  1% lidocaine  w/ epinephrine  1-100,000 local infiltration Curettage performed in three different directions: Yes   Electrodesiccation performed over the curetted area: Yes   Curettage cycles:  3 Lesion length (cm):  1.3 Lesion width (cm):  1.3 Margin per side (cm):  0 Final wound size (cm):  1.3 Hemostasis achieved with:  ferric subsulfate and electrodesiccation Outcome: patient tolerated procedure well with no complications   Post-procedure details: sterile dressing applied and wound care instructions given   Dressing type: bandage and petrolatum    Specimen 3 - Surgical pathology Differential Diagnosis: R/O BCC vs SCC - treated after biopsy  Check Margins: No  After shave biopsy the base with curetted and cauterized  AK (actinic keratosis) (8) Right Preauricular Area; Scalp (7)  Destruction of lesion - Right Preauricular Area, Scalp Complexity: simple   Destruction method: cryotherapy   Informed consent: discussed and consent obtained   Timeout:  patient name, date of birth, surgical site, and procedure verified Lesion destroyed using liquid nitrogen: Yes   Cryotherapy cycles:  3 Outcome: patient tolerated procedure well with no complications   Post-procedure details: wound care instructions given    Carcinoma in situ of skin of scalp and neck (3) Left Mid Parietal Scalp  Skin / nail biopsy Type of biopsy: tangential   Informed consent: discussed and consent obtained   Timeout: patient name, date of birth, surgical site, and procedure verified   Procedure prep:  Patient was prepped and draped in usual sterile fashion (Non sterile) Prep type:  Chlorhexidine Anesthesia: the lesion was anesthetized in a standard fashion   Anesthetic:  1% lidocaine  w/ epinephrine  1-100,000 local infiltration Instrument used: flexible razor blade   Hemostasis achieved with: ferric subsulfate and electrodesiccation   Outcome: patient  tolerated procedure well   Post-procedure details: sterile dressing applied and wound care instructions given   Dressing type: bandage and petrolatum    Destruction of lesion Complexity: simple   Destruction method: electrodesiccation and curettage   Informed consent: discussed and consent obtained   Timeout:  patient name, date of birth, surgical site, and procedure verified Anesthesia: the lesion was anesthetized in a standard fashion   Anesthetic:  1% lidocaine  w/ epinephrine  1-100,000 local infiltration Curettage performed in three different directions: Yes   Electrodesiccation performed over the curetted area: Yes   Curettage cycles:  3 Lesion length (cm):  1.3 Lesion width (cm):  1.3 Margin per side (cm):  0 Final wound size (cm):  1.3 Hemostasis achieved with:  ferric subsulfate and electrodesiccation Outcome: patient tolerated procedure well with no complications   Post-procedure details: wound care instructions given    Specimen 1 - Surgical pathology Differential Diagnosis: R/O BCC vs SCC - treated after biopsy  Check Margins: No  Mid Parietal Scalp  Skin / nail biopsy Type of biopsy: tangential   Informed consent: discussed and consent obtained   Timeout: patient name, date of birth, surgical site, and procedure verified   Procedure prep:  Patient was prepped and draped in usual sterile fashion (Non sterile) Prep type:  Chlorhexidine Anesthesia: the lesion was anesthetized in a standard fashion   Anesthetic:  1% lidocaine  w/ epinephrine  1-100,000 local infiltration Instrument used: flexible razor blade   Hemostasis achieved with: ferric subsulfate and electrodesiccation   Outcome: patient tolerated procedure well   Post-procedure details: sterile dressing applied and wound care instructions given   Dressing type: bandage and petrolatum    Destruction of lesion Complexity: simple   Destruction method: electrodesiccation and curettage   Informed consent:  discussed and consent obtained   Timeout:  patient name, date of birth, surgical site, and procedure verified Anesthesia: the lesion was anesthetized in a standard fashion   Anesthetic:  1% lidocaine  w/ epinephrine  1-100,000 local infiltration Curettage performed in three different directions: Yes   Electrodesiccation performed over the curetted area: Yes   Curettage cycles:  3 Lesion length (cm):  1.4 Lesion width (cm):  1.4 Margin per side (cm):  0 Final wound size (cm):  1.4 Hemostasis achieved with:  ferric subsulfate and electrodesiccation Outcome: patient tolerated procedure well with no complications   Post-procedure details: sterile dressing applied and wound care instructions given   Dressing type: bandage and petrolatum    Specimen 2 - Surgical pathology Differential Diagnosis: R/O BCC vs SCC - treated after biopsy  Check Margins: No  Lateral Mid Frontal Scalp  Skin / nail biopsy Type of biopsy: tangential   Informed consent: discussed and consent obtained   Timeout: patient name, date of birth, surgical site, and procedure verified   Procedure prep:  Patient was prepped and draped in usual sterile fashion (Non sterile) Prep type:  Chlorhexidine Anesthesia: the lesion was anesthetized in a standard fashion   Anesthetic:  1% lidocaine  w/ epinephrine  1-100,000 local infiltration Instrument used: flexible razor blade   Hemostasis achieved with: ferric subsulfate and electrodesiccation   Outcome: patient tolerated procedure well   Post-procedure details: sterile dressing applied and wound care instructions given   Dressing type: bandage and petrolatum  Destruction of lesion Complexity: simple   Destruction method: electrodesiccation and curettage   Informed consent: discussed and consent obtained   Timeout:  patient name, date of birth, surgical site, and procedure verified Anesthesia: the lesion was anesthetized in a standard fashion   Anesthetic:  1% lidocaine  w/  epinephrine  1-100,000 local infiltration Curettage performed in three different directions: Yes   Electrodesiccation performed over the curetted area: Yes   Curettage cycles:  3 Lesion length (cm):  1.3 Lesion width (cm):  1.3 Margin per side (cm):  0 Final wound size (cm):  1.3 Hemostasis achieved with:  ferric subsulfate and electrodesiccation Outcome: patient tolerated procedure well with no complications   Post-procedure details: sterile dressing applied and wound care instructions given   Dressing type: bandage and petrolatum    Specimen 4 - Surgical pathology Differential Diagnosis: R/O BCC vs SCC - treated after biopsy  Check Margins: No  Each lesion after shave biopsy was treated with curettage plus cautery.      I, Glean Burrow, MD, have reviewed all documentation for this visit.  The documentation on 11/30/21 for the exam, diagnosis, procedures, and orders are all accurate and complete.

## 2022-01-15 DIAGNOSIS — E871 Hypo-osmolality and hyponatremia: Secondary | ICD-10-CM | POA: Diagnosis not present

## 2022-01-15 DIAGNOSIS — E1169 Type 2 diabetes mellitus with other specified complication: Secondary | ICD-10-CM | POA: Diagnosis not present

## 2022-01-15 DIAGNOSIS — R202 Paresthesia of skin: Secondary | ICD-10-CM | POA: Diagnosis not present

## 2022-02-14 ENCOUNTER — Ambulatory Visit (INDEPENDENT_AMBULATORY_CARE_PROVIDER_SITE_OTHER): Payer: Medicare Other | Admitting: Dermatology

## 2022-02-14 DIAGNOSIS — L570 Actinic keratosis: Secondary | ICD-10-CM

## 2022-02-14 DIAGNOSIS — Z85820 Personal history of malignant melanoma of skin: Secondary | ICD-10-CM | POA: Diagnosis not present

## 2022-02-14 DIAGNOSIS — Z1283 Encounter for screening for malignant neoplasm of skin: Secondary | ICD-10-CM | POA: Diagnosis not present

## 2022-02-14 DIAGNOSIS — L821 Other seborrheic keratosis: Secondary | ICD-10-CM

## 2022-02-14 DIAGNOSIS — L820 Inflamed seborrheic keratosis: Secondary | ICD-10-CM

## 2022-02-14 DIAGNOSIS — Z85828 Personal history of other malignant neoplasm of skin: Secondary | ICD-10-CM

## 2022-02-18 ENCOUNTER — Other Ambulatory Visit (HOSPITAL_COMMUNITY): Payer: Self-pay | Admitting: Internal Medicine

## 2022-02-18 DIAGNOSIS — M79605 Pain in left leg: Secondary | ICD-10-CM

## 2022-02-19 ENCOUNTER — Ambulatory Visit (HOSPITAL_COMMUNITY)
Admission: RE | Admit: 2022-02-19 | Discharge: 2022-02-19 | Disposition: A | Discharge status: Home / Self Care | Payer: Medicare Other | Source: Ambulatory Visit | Attending: Internal Medicine | Admitting: Internal Medicine

## 2022-02-19 DIAGNOSIS — M79605 Pain in left leg: Secondary | ICD-10-CM | POA: Insufficient documentation

## 2022-02-19 NOTE — Progress Notes (Signed)
Left lower extremity venous duplex completed. Refer to "CV Proc" under chart review to view preliminary results.  02/19/2022 11:35 AM Kelby Aline., MHA, RVT, RDCS, RDMS

## 2022-03-01 DIAGNOSIS — M0089 Polyarthritis due to other bacteria: Secondary | ICD-10-CM | POA: Diagnosis not present

## 2022-03-01 DIAGNOSIS — M19011 Primary osteoarthritis, right shoulder: Secondary | ICD-10-CM | POA: Diagnosis not present

## 2022-03-10 ENCOUNTER — Encounter: Payer: Self-pay | Admitting: Dermatology

## 2022-03-10 NOTE — Progress Notes (Signed)
   Follow-Up Visit   Subjective  Danny Molina is a 87 y.o. male who presents for the following: Annual Exam (Has lesion near old melanoma spot on left leg. Also check lesion on left chest area. One on lower back. History of melanoma and non mole skin cancer. ).  Patient has felt fullness near the site of the melanoma biopsy left calf, check several other spots Location:  Duration:  Quality:  Associated Signs/Symptoms: Modifying Factors:  Severity:  Timing: Context:   Objective  Well appearing patient in no apparent distress; mood and affect are within normal limits. Full body skin exam: Recurrent atypical pigmented lesions (all checked with dermoscopy), no nonmelanoma skin cancer  Left Abdomen (side) - Upper Brown 6 mm flattopped textured papule, compatible dermoscopy  Right Lower Back Slightly inflamed pink flattopped 6 mm papule  Head - Anterior (Face) (6) Multiple 2 to 4 mm hornlike crusts  Left Lower Leg - Posterior There is a subcutaneous fullness of the upper calf, negative Homans, no cords.    A full examination was performed including scalp, head, eyes, ears, nose, lips, neck, chest, axillae, abdomen, back, buttocks, bilateral upper extremities, bilateral lower extremities, hands, feet, fingers, toes, fingernails, and toenails. All findings within normal limits unless otherwise noted below.  Areas beneath undergarments not fully examined.   Assessment & Plan    Screening for malignant neoplasm of skin  Needs to see PCP for an ultrasound on left calf. Soft tissue swelling. R/O DVT.  Seborrheic keratosis Left Abdomen (side) - Upper  Leave if stable  Lichenoid keratosis Right Lower Back  No intervention initiated  AK (actinic keratosis) (6) Head - Anterior (Face)  Destruction of lesion - Head - Anterior (Face) Complexity: simple   Destruction method: cryotherapy   Informed consent: discussed and consent obtained   Timeout:  patient name, date of  birth, surgical site, and procedure verified Lesion destroyed using liquid nitrogen: Yes   Cryotherapy cycles:  5 Outcome: patient tolerated procedure well with no complications    Personal history of malignant melanoma of skin Left Lower Leg - Posterior  His primary care physician can schedule for him to have an ultrasound; if this is negative, I would have him return to the melanoma surgeon for a deep biopsy.      I, [name], MD, have reviewed all documentation for this visit.  The documentation on 03/10/22 for the exam, diagnosis, procedures, and orders are all accurate and complete.

## 2022-06-19 DIAGNOSIS — R600 Localized edema: Secondary | ICD-10-CM | POA: Diagnosis not present

## 2022-07-18 DIAGNOSIS — Z85828 Personal history of other malignant neoplasm of skin: Secondary | ICD-10-CM | POA: Diagnosis not present

## 2022-07-18 DIAGNOSIS — L570 Actinic keratosis: Secondary | ICD-10-CM | POA: Diagnosis not present

## 2022-10-18 ENCOUNTER — Other Ambulatory Visit: Payer: Self-pay

## 2022-10-18 ENCOUNTER — Emergency Department (HOSPITAL_COMMUNITY)
Admission: EM | Admit: 2022-10-18 | Discharge: 2022-10-18 | Disposition: A | Discharge status: Home / Self Care | Payer: Medicare Other | Attending: Emergency Medicine | Admitting: Emergency Medicine

## 2022-10-18 ENCOUNTER — Encounter (HOSPITAL_COMMUNITY): Payer: Self-pay | Admitting: Emergency Medicine

## 2022-10-18 DIAGNOSIS — Z7982 Long term (current) use of aspirin: Secondary | ICD-10-CM | POA: Diagnosis not present

## 2022-10-18 DIAGNOSIS — Z79899 Other long term (current) drug therapy: Secondary | ICD-10-CM | POA: Diagnosis not present

## 2022-10-18 DIAGNOSIS — R42 Dizziness and giddiness: Secondary | ICD-10-CM | POA: Diagnosis not present

## 2022-10-18 DIAGNOSIS — I10 Essential (primary) hypertension: Secondary | ICD-10-CM | POA: Diagnosis not present

## 2022-10-18 DIAGNOSIS — H81399 Other peripheral vertigo, unspecified ear: Secondary | ICD-10-CM

## 2022-10-18 LAB — COMPREHENSIVE METABOLIC PANEL
ALT: 17 U/L (ref 0–44)
AST: 20 U/L (ref 15–41)
Albumin: 3.8 g/dL (ref 3.5–5.0)
Alkaline Phosphatase: 67 U/L (ref 38–126)
Anion gap: 9 (ref 5–15)
BUN: 23 mg/dL (ref 8–23)
CO2: 24 mmol/L (ref 22–32)
Calcium: 8.7 mg/dL — ABNORMAL LOW (ref 8.9–10.3)
Chloride: 100 mmol/L (ref 98–111)
Creatinine, Ser: 0.96 mg/dL (ref 0.61–1.24)
GFR, Estimated: >60 mL/min (ref >60)
Glucose, Bld: 145 mg/dL — ABNORMAL HIGH (ref 70–99)
Potassium: 3.9 mmol/L (ref 3.5–5.1)
Sodium: 133 mmol/L — ABNORMAL LOW (ref 135–145)
Total Bilirubin: 0.7 mg/dL (ref 0.3–1.2)
Total Protein: 7.6 g/dL (ref 6.5–8.1)

## 2022-10-18 LAB — CBC
HCT: 40.7 % (ref 39.0–52.0)
Hemoglobin: 14 g/dL (ref 13.0–17.0)
MCH: 32.3 pg (ref 26.0–34.0)
MCHC: 34.4 g/dL (ref 30.0–36.0)
MCV: 93.8 fL (ref 80.0–100.0)
Platelets: 168 10*3/uL (ref 150–400)
RBC: 4.34 MIL/uL (ref 4.22–5.81)
RDW: 14.1 % (ref 11.5–15.5)
WBC: 14 10*3/uL — ABNORMAL HIGH (ref 4.0–10.5)
nRBC: 0 % (ref 0.0–0.2)

## 2022-10-18 MED ORDER — ONDANSETRON 4 MG PO TBDP: 4.0000 mg | ORAL_TABLET | Freq: Three times a day (TID) | ORAL | 0 refills | Status: AC | PRN

## 2022-10-18 MED ORDER — ONDANSETRON 4 MG PO TBDP
4.0000 mg | ORAL_TABLET | Freq: Once | ORAL | Status: AC
  Administered 2022-10-18: 4 mg via ORAL
  Filled 2022-10-18: qty 1

## 2022-10-18 MED ORDER — MECLIZINE HCL 25 MG PO TABS: 25.0000 mg | ORAL_TABLET | Freq: Three times a day (TID) | ORAL | 0 refills | Status: AC | PRN

## 2022-10-18 MED ORDER — MECLIZINE HCL 12.5 MG PO TABS
25.0000 mg | ORAL_TABLET | Freq: Once | ORAL | Status: AC
  Administered 2022-10-18: 25 mg via ORAL
  Filled 2022-10-18: qty 2

## 2022-10-18 NOTE — ED Notes (Signed)
Pt states that the nausea has improved

## 2022-10-18 NOTE — ED Triage Notes (Addendum)
Pt with sudden onset dizziness that started around 6pm. States he vomited as a result. EMS gave Zofran 4mg  IV en route. Pt states he's "just a little dizzy now". EMS states pt had some wine just before getting sick.

## 2022-10-18 NOTE — ED Notes (Signed)
ED Provider at bedside. 

## 2022-10-18 NOTE — ED Notes (Addendum)
sat pt on the side of the bed and as he sat there he stated the room was moving. This RN and NT let him sit there for a few minutes and the room moving did not improve nor get worse. pt stood beside the bed and pt stated the room was still moving. we did not ambulate as we felt it would not be safe to do so. EDP made aware.

## 2022-10-18 NOTE — Discharge Instructions (Signed)
Please see your doctor in 3 days for recheck Your testing has been reassuring, no specific abnormalities Read the attached instructions regarding vertigo Try to get as much rest and fluids that she can over the next couple of days Your symptoms will get worse when you try to stand up so do so very slowly  Thank you for allowing [place] to treat you in the emergency department today.  After reviewing your examination and potential testing that was done it appears that you are safe to go home.  I would like for you to follow-up with your doctor within the next several days, have them obtain your results and follow-up with them to review all of these tests.  If you should develop severe or worsening symptoms return to the emergency department immediately  Zofran is a medication which can help with nausea.  You may take 4 mg by mouth every 6 hours as needed if you are an adult, if your child under the age of 6 take half of a tablet or 2 mg every 6 hours as needed.  This should dissolve on your tongue within a short timeframe.  Wait about 30 minutes after taking it to help with drinking clear liquids.

## 2022-10-18 NOTE — ED Provider Notes (Signed)
[facility] Provider Note   CSN: [id number] Arrival date & time: 10/18/22  1943     History  Chief Complaint  Patient presents with   Dizziness    Danny Molina is a 88 y.o. male.   Dizziness  This patient is an 88 year old male, history of hypertension presenting with a complaint of acute onset of dizziness which occurred while he was watching TV.  He reports this was a feeling of the room spinning, felt very nauseated, feels like it is completely gone away at this point.  He reports he gets worse when he tries to stand, better when he holds perfectly still and not associated with any changes in vision numbness or weakness.    Home Medications Prior to Admission medications   Medication Sig Start Date End Date Taking? Authorizing Provider  acetaminophen (TYLENOL) 500 MG tablet Take 500 mg by mouth every 6 (six) hours as needed for moderate pain.   Yes [provider]  amLODipine (NORVASC) 10 MG tablet Take 5 mg by mouth daily.   Yes [provider]  aspirin 81 MG tablet Take 81 mg by mouth daily.   Yes [provider]  atorvastatin (LIPITOR) 40 MG tablet Take 20 mg by mouth daily.   Yes [provider]  augmented betamethasone dipropionate (DIPROLENE-AF) 0.05 % ointment APPLY TO AFFECTED AREA TWICE A DAY AS NEEDED (NOT FACE, GROIN, UNDER ARMS) 05/20/22  Yes [provider]  chlorpheniramine (CHLOR-TRIMETON) 4 MG tablet Take 4 mg by mouth 2 (two) times daily as needed.   Yes [provider]  doxazosin (CARDURA) 8 MG tablet Take 4 mg by mouth at bedtime. 1/2 tab qd   Yes [provider]  fish oil-omega-3 fatty acids 1000 MG capsule Take 1,200 g by mouth 2 (two) times daily.    Yes [provider]  fluticasone (FLONASE) 50 MCG/ACT nasal spray Place 1 spray into both nostrils daily. 11/23/21  Yes [provider]  furosemide (LASIX) 20 MG tablet Take 20 mg by  mouth daily. Reported on 09/04/2015   Yes [provider]  gabapentin (NEURONTIN) 300 MG capsule Take 600 mg by mouth at bedtime. 02/02/20  Yes [provider]  Halcinonide (HALOG) 0.1 % CREA Apply 1 application topically every morning. 05/25/20  Yes [name], MD  losartan (COZAAR) 50 MG tablet Take 100 mg by mouth daily.   Yes [provider]  meclizine (ANTIVERT) 25 MG tablet Take 1 tablet (25 mg total) by mouth 3 (three) times daily as needed for dizziness. 10/18/22  Yes Noemi Chapel, MD  metFORMIN (GLUCOPHAGE) 500 MG tablet Take 500 mg by mouth daily with breakfast.   Yes [provider]  metoprolol (TOPROL-XL) 200 MG 24 hr tablet Take 100 mg by mouth daily.    Yes [provider]  montelukast (SINGULAIR) 10 MG tablet Take 10 mg by mouth daily. 09/23/22  Yes [provider]  Multiple Vitamin (MULTIVITAMIN) tablet Take 1 tablet by mouth daily.   Yes [provider]  omeprazole (PRILOSEC) 20 MG capsule Take 20 mg by mouth daily.   Yes [provider]  ondansetron (ZOFRAN-ODT) 4 MG disintegrating tablet Take 1 tablet (4 mg total) by mouth every 8 (eight) hours as needed for nausea. 10/18/22  Yes Noemi Chapel, MD      Allergies    Penicillins and Diclofenac    Review of Systems   Review of Systems  Neurological:  Positive for  dizziness.  All other systems reviewed and are negative.   Physical Exam Updated Vital Signs BP (!) 152/64   Pulse (!) 55   Temp 97.6 F (36.4 C) (Oral)   Resp 13   Ht 1.753 m (5\' 9" )   Wt 79.4 kg   SpO2 96%   BMI 25.84 kg/m  Physical Exam Vitals and nursing note reviewed.  Constitutional:      General: He is not in acute distress.    Appearance: He is well-developed.  HENT:     Head: Normocephalic and atraumatic.     Mouth/Throat:     Pharynx: No oropharyngeal exudate.  Eyes:     General: No scleral icterus.       Right eye: No discharge.        Left eye: No discharge.      Conjunctiva/sclera: Conjunctivae normal.     Pupils: Pupils are equal, round, and reactive to light.  Neck:     Thyroid: No thyromegaly.     Vascular: No JVD.  Cardiovascular:     Rate and Rhythm: Normal rate and regular rhythm.     Heart sounds: Normal heart sounds. No murmur heard.    No friction rub. No gallop.  Pulmonary:     Effort: Pulmonary effort is normal. No respiratory distress.     Breath sounds: Normal breath sounds. No wheezing or rales.  Abdominal:     General: Bowel sounds are normal. There is no distension.     Palpations: Abdomen is soft. There is no mass.     Tenderness: There is no abdominal tenderness.  Musculoskeletal:        General: No tenderness. Normal range of motion.     Cervical back: Normal range of motion and neck supple.  Lymphadenopathy:     Cervical: No cervical adenopathy.  Skin:    General: Skin is warm and dry.     Findings: No erythema or rash.  Neurological:     Mental Status: He is alert.     Coordination: Coordination normal.     Comments: Speech is clear, cranial nerves III through XII are intact, memory is intact, strength is normal in all 4 extremities including grips, sensation is intact to light touch and pinprick in all 4 extremities. Coordination as tested by finger-nose-finger is normal, no limb ataxia. Normal gait, normal reflexes at the patellar tendons bilaterally  Psychiatric:        Behavior: Behavior normal.     ED Results / Procedures / Treatments   Labs (all labs ordered are listed, but only abnormal results are displayed) Labs Reviewed  CBC - Abnormal; Notable for the following components:      Result Value   WBC 14.0 (*)    All other components within normal limits  COMPREHENSIVE METABOLIC PANEL - Abnormal; Notable for the following components:   Sodium 133 (*)    Glucose, Bld 145 (*)    Calcium 8.7 (*)    All other components within normal limits    EKG EKG Interpretation  Date/Time:  Friday October 18 2022  19:59:10 EDT Ventricular Rate:  72 PR Interval:  71 QRS Duration: 128 QT Interval:  421 QTC Calculation: 435 R Axis:   127 Text Interpretation: Sinus rhythm Atrial premature complexes Short PR interval Nonspecific intraventricular conduction delay Anterior infarct, age indeterminate Lateral leads are also involved Baseline wander in lead(s) V2 V3 Confirmed by Noemi Chapel [phone]) on 10/18/2022 8:02:08 PM  Radiology No results found.  Procedures  Procedures    Medications Ordered in ED Medications  meclizine (ANTIVERT) tablet 25 mg (has no administration in time range)  ondansetron (ZOFRAN-ODT) disintegrating tablet 4 mg (has no administration in time range)    ED Course/ Medical Decision Making/ A&P                             Medical Decision Making Amount and/or Complexity of Data Reviewed Labs: ordered.  Risk Prescription drug management.   Exam is unremarkable, vital signs are unremarkable, he is not hypertensive and has no focal neurologic deficits.  Suspect source of vertigo is peripheral, the acute onset and offset and holding still resolving symptoms suggest a peripheral cause.  He has a cerumen impaction in his right ear, exam is otherwise unremarkable, labs pending, anticipate discharge if negative.  He was given Zofran by EMS and improved significantly with his nausea  Labs: I personally viewed and interpreted labs which show a nonspecific leukocytosis of [id number], metabolic panel is unremarkable  EKG is unremarkable no signs of acute ischemia or arrhythmia, rate controlled  Repeat exam: Patient is well-appearing, able to sit up in the bed however when he tries to stand it becomes a acutely vertiginous.  The patient was able to get up very slowly, recommended discharge with follow-up.  Given a urinal for home, medicine such as meclizine and Zofran for home, both he and his wife expressed her understanding to the likelihood that he will likely have ongoing symptoms for  couple of days          Final Clinical Impression(s) / ED Diagnoses Final diagnoses:  Peripheral vertigo, unspecified laterality    Rx / DC Orders ED Discharge Orders          Ordered    meclizine (ANTIVERT) 25 MG tablet  3 times daily PRN        10/18/22 2312    ondansetron (ZOFRAN-ODT) 4 MG disintegrating tablet  Every 8 hours PRN        10/18/22 2312              Noemi Chapel, MD 10/18/22 2314

## 2022-10-30 ENCOUNTER — Telehealth: Payer: Self-pay | Admitting: *Deleted

## 2022-10-30 NOTE — Telephone Encounter (Signed)
        Patient  visited [facility] on 10/18/2022  for treatment    Telephone encounter attempt :  1st  A HIPAA compliant voice message was left requesting a return call.  Instructed patient to call back at [phone]. [facility] [phone] [address] , [place] [address] Email : [email or web address]

## 2022-10-30 NOTE — Telephone Encounter (Signed)
     Patient  visit on 10/18/2022  at [facility]  was for treatment  Have you been able to follow up with your primary care physician? Patient is over the vertigo and patient has his medicines and transportation  The patient was able to obtain any needed medicine or equipment.  Are there diet recommendations that you are having difficulty following?  Patient expresses understanding of discharge instructions and education provided has no other needs at this time.    [facility] [phone] [address] , [place] [address] Email : [email or web address]

## 2022-11-20 ENCOUNTER — Other Ambulatory Visit: Payer: Self-pay | Admitting: *Deleted

## 2022-11-20 DIAGNOSIS — Z9889 Other specified postprocedural states: Secondary | ICD-10-CM

## 2022-12-02 ENCOUNTER — Ambulatory Visit (HOSPITAL_COMMUNITY)
Admission: RE | Admit: 2022-12-02 | Discharge: 2022-12-02 | Disposition: A | Discharge status: Home / Self Care | Payer: Medicare Other | Source: Ambulatory Visit | Attending: Surgery | Admitting: Surgery

## 2022-12-02 ENCOUNTER — Ambulatory Visit (INDEPENDENT_AMBULATORY_CARE_PROVIDER_SITE_OTHER): Payer: Medicare Other | Admitting: Physician Assistant

## 2022-12-02 VITALS — BP 146/60 | HR 53 | Temp 97.4°F | Resp 16 | Ht 70.5 in | Wt 179.0 lb

## 2022-12-02 DIAGNOSIS — Z87891 Personal history of nicotine dependence: Secondary | ICD-10-CM | POA: Diagnosis not present

## 2022-12-02 DIAGNOSIS — Z9889 Other specified postprocedural states: Secondary | ICD-10-CM

## 2022-12-02 DIAGNOSIS — I714 Abdominal aortic aneurysm, without rupture, unspecified: Secondary | ICD-10-CM

## 2022-12-02 DIAGNOSIS — I7143 Infrarenal abdominal aortic aneurysm, without rupture: Secondary | ICD-10-CM

## 2022-12-02 DIAGNOSIS — E1159 Type 2 diabetes mellitus with other circulatory complications: Secondary | ICD-10-CM | POA: Diagnosis not present

## 2022-12-02 DIAGNOSIS — I10 Essential (primary) hypertension: Secondary | ICD-10-CM | POA: Diagnosis not present

## 2022-12-02 DIAGNOSIS — Z95828 Presence of other vascular implants and grafts: Secondary | ICD-10-CM

## 2022-12-02 NOTE — Progress Notes (Signed)
Office Note     CC:  follow up Requesting Provider:  Charlane Ferretti, DO  HPI: Danny Molina is a 88 y.o. (04/22/1935) male who presents for surveillance of endovascular repair of abdominal aortic aneurysm.  This was performed by Dr. Myra Gianotti on 09/21/2009.  He denies any new or changing abdominal or back pain.  He also denies any claudication, rest pain, or tissue loss of bilateral lower extremities.  He is on a daily aspirin and statin.  He is a former smoker.   Past Medical History:  Diagnosis Date   Arthritis    BCC (basal cell carcinoma of skin) 05/20/2005   LEFT POST SHOULDER- TX CURET X 3,5FU   BCC (basal cell carcinoma of skin) 12/02/2011   NOSE-TX MOHS   BCC (basal cell carcinoma of skin) 07/15/2016   SUPERFICIAL- RIGHT UPPER BACK- TX CURET AFTER BIOPSY   BCC (basal cell carcinoma of skin) 07/15/2016   SUPERFICIAL/ULCERATED RIGHT FOREARM- TX CURET AFRER BIOPSY   BCC (basal cell carcinoma of skin) 07/06/2018   SUPERFICIAL,NODULAR, INFLITRATIVE- LEFT NOSE- TX MOHS   Diabetes mellitus    Fall down stairs 07/2013   At a friends home   GERD (gastroesophageal reflux disease)    Hypertension    Malignant melanoma of left lower leg (HCC) 11/01/2015   outer left calf. tx excision   SCC (squamous cell carcinoma) 01/19/2018   RIGHT SIDEBURN- TX CURET X 3,EXCISION, 5FU   SCC (squamous cell carcinoma) 01/19/2018   WELL DIFF-LEFT SUP CROWN SCALP- TX CURET AFTER BIOPSY   SCC (squamous cell carcinoma) 11/27/2020   SCC (squamous cell carcinoma) 11/27/2020   mid parietal scalp ant (CX35FU)   SCC (squamous cell carcinoma) 11/27/2020   mid parietal scalp post (CX35FU)   SCCA (squamous cell carcinoma) of skin 11/22/2010   RIGHT SCALP POSTERIOR- TX CURET AFTER BIOPSY   SCCA (squamous cell carcinoma) of skin 11/22/2010   RIGHT SCALP MIDDLE- TX CURET AFTER BIOPSY   SCCA (squamous cell carcinoma) of skin 11/22/2010   RIGHT SCALP ANTERIOR-TX CURET AFTER BIOPSY   SCCA (squamous cell  carcinoma) of skin 03/07/2011   RIGHT CROWN- TX CURET AFTER BIOPSY   SCCA (squamous cell carcinoma) of skin 10/12/2012   RIGHT CROWN POST- TX CURET AFTER BIOPSY   SCCA (squamous cell carcinoma) of skin 11/19/2012   CROWN- TX CURET AFTER BIOPSY   SCCA (squamous cell carcinoma) of skin 12/15/2013   RIGHT FOREHEAD- TX CURET AFTER BIOPSY   SCCA (squamous cell carcinoma) of skin 05/23/2015   LEFT SCALP- TX CURET AFTER BIOPSY   SCCA (squamous cell carcinoma) of skin 05/23/2015   CENTER SCALP- TX CURET AFTER BIOPSY   SCCA (squamous cell carcinoma) of skin 05/23/2015   MID CROWN SCALP- TX CURET AFTER BIOPSY   SCCA (squamous cell carcinoma) of skin 05/23/2015   RIGHT CROWN MEDIAL- TX CURET AFTER BIOPSY   SCCA (squamous cell carcinoma) of skin 05/23/2015   RIGHT CROWN LATERAL- TX CURET AFTER BIOPSY   SCCA (squamous cell carcinoma) of skin 01/19/2018   RIGHT CROWN SUPERIOR- TX CURET AFTER BIOPSY   SCCA (squamous cell carcinoma) of skin 01/19/2018   RIGHT CROWN INFERIOR- TX CURET AFTER BIOPSY   SCCA (squamous cell carcinoma) of skin 07/06/2018   RIGHT FRONT SCALP INFERIOR- TX CURET AFTER BIOPSY   SCCA (squamous cell carcinoma) of skin 07/06/2018   RIGHT FRONT SCALP, SUPERIOR- TX CURET AFTER BIOPSY   SCCA (squamous cell carcinoma) of skin 07/06/2018   RIGHT MEDIAL SCALP- TX CURET AFTER  BIOPSY   SCCA (squamous cell carcinoma) of skin 07/06/2018   RIGHT POST SCALP- CURET AFTER BIOPSY   SCCA (squamous cell carcinoma) of skin 08/27/2018   RIGHT CHEEK- TX CURET AFTER BIOPSY    Past Surgical History:  Procedure Laterality Date   ABDOMINAL AORTIC ANEURYSM REPAIR  09/21/2009   LEG SURGERY Left     Social History   Socioeconomic History   Marital status: Married    Spouse name: Not on file   Number of children: Not on file   Years of education: Not on file   Highest education level: Not on file  Occupational History   Not on file  Tobacco Use   Smoking status: Former    Types:  Cigarettes    Quit date: 01/02/1993    Years since quitting: 29.9   Smokeless tobacco: Never  Vaping Use   Vaping Use: Never used  Substance and Sexual Activity   Alcohol use: Yes    Alcohol/week: 1.0 standard drink of alcohol    Types: 1 Shots of liquor per week   Drug use: No   Sexual activity: Not on file  Other Topics Concern   Not on file  Social History Narrative   Not on file   Social Determinants of Health   Financial Resource Strain: Not on file  Food Insecurity: Not on file  Transportation Needs: Not on file  Physical Activity: Not on file  Stress: Not on file  Social Connections: Not on file  Intimate Partner Violence: Not on file    Family History  Problem Relation Age of Onset   Heart disease Mother        Heart Disease before age 66   Hypertension Mother    Heart attack Mother    Cancer Father    Heart disease Father    Heart attack Father    Hypertension Sister    Diabetes Sister    Diabetes Brother    Heart disease Brother    Hypertension Brother    Heart attack Brother    Diabetes Brother     Current Outpatient Medications  Medication Sig Dispense Refill   acetaminophen (TYLENOL) 500 MG tablet Take 500 mg by mouth every 6 (six) hours as needed for moderate pain.     amLODipine (NORVASC) 10 MG tablet Take 5 mg by mouth daily.     aspirin 81 MG tablet Take 81 mg by mouth daily.     atorvastatin (LIPITOR) 40 MG tablet Take 20 mg by mouth daily.     augmented betamethasone dipropionate (DIPROLENE-AF) 0.05 % ointment APPLY TO AFFECTED AREA TWICE A DAY AS NEEDED (NOT FACE, GROIN, UNDER ARMS)     chlorpheniramine (CHLOR-TRIMETON) 4 MG tablet Take 4 mg by mouth 2 (two) times daily as needed.     doxazosin (CARDURA) 8 MG tablet Take 4 mg by mouth at bedtime. 1/2 tab qd     fish oil-omega-3 fatty acids 1000 MG capsule Take 1,200 g by mouth 2 (two) times daily.      fluticasone (FLONASE) 50 MCG/ACT nasal spray Place 1 spray into both nostrils daily.      furosemide (LASIX) 20 MG tablet Take 20 mg by mouth daily. Reported on 09/04/2015     gabapentin (NEURONTIN) 300 MG capsule Take 600 mg by mouth at bedtime.     Halcinonide (HALOG) 0.1 % CREA Apply 1 application topically every morning. 60 g 0   losartan (COZAAR) 50 MG tablet Take 100 mg by mouth daily.  meclizine (ANTIVERT) 25 MG tablet Take 1 tablet (25 mg total) by mouth 3 (three) times daily as needed for dizziness. 30 tablet 0   metFORMIN (GLUCOPHAGE) 500 MG tablet Take 500 mg by mouth daily with breakfast.     metoprolol (TOPROL-XL) 200 MG 24 hr tablet Take 100 mg by mouth daily.      montelukast (SINGULAIR) 10 MG tablet Take 10 mg by mouth daily.     Multiple Vitamin (MULTIVITAMIN) tablet Take 1 tablet by mouth daily.     omeprazole (PRILOSEC) 20 MG capsule Take 20 mg by mouth daily.     ondansetron (ZOFRAN-ODT) 4 MG disintegrating tablet Take 1 tablet (4 mg total) by mouth every 8 (eight) hours as needed for nausea. 10 tablet 0   No current facility-administered medications for this visit.    Allergies  Allergen Reactions   Penicillins Anaphylaxis and Rash   Diclofenac Other (See Comments)     REVIEW OF SYSTEMS:   [X]  denotes positive finding, [ ]  denotes negative finding Cardiac  Comments:  Chest pain or chest pressure:    Shortness of breath upon exertion:    Short of breath when lying flat:    Irregular heart rhythm:        Vascular    Pain in calf, thigh, or hip brought on by ambulation:    Pain in feet at night that wakes you up from your sleep:     Blood clot in your veins:    Leg swelling:         Pulmonary    Oxygen at home:    Productive cough:     Wheezing:         Neurologic    Sudden weakness in arms or legs:     Sudden numbness in arms or legs:     Sudden onset of difficulty speaking or slurred speech:    Temporary loss of vision in one eye:     Problems with dizziness:         Gastrointestinal    Blood in stool:     Vomited blood:          Genitourinary    Burning when urinating:     Blood in urine:        Psychiatric    Major depression:         Hematologic    Bleeding problems:    Problems with blood clotting too easily:        Skin    Rashes or ulcers:        Constitutional    Fever or chills:      PHYSICAL EXAMINATION:  Vitals:   12/02/22 0948  BP: (!) 146/60  Pulse: (!) 53  Resp: 16  Temp: (!) 97.4 F (36.3 C)  TempSrc: Temporal  SpO2: 94%  Weight: 179 lb (81.2 kg)  Height: 5' 10.5" (1.791 m)    General:  WDWN in NAD; vital signs documented above Gait: Not observed HENT: WNL, normocephalic Pulmonary: normal non-labored breathing , without Rales, rhonchi,  wheezing Cardiac: regular HR Abdomen: soft, NT, no masses Skin: without rashes Vascular Exam/Pulses: palpable PT pulses bilaterally Extremities: without ischemic changes, without Gangrene , without cellulitis; without open wounds;  Musculoskeletal: no muscle wasting or atrophy  Neurologic: A&O X 3 Psychiatric:  The pt has Normal affect.   Non-Invasive Vascular Imaging:   4.9 cm AAA sac at the largest diameter Findings concerning for endoleak however sac size has been unchanged over the  past several visits    ASSESSMENT/PLAN:: 88 y.o. male here for surveillance of endovascular repair of abdominal aortic aneurysm  -EVAR duplex demonstrates a stable AAA sac size measuring 4.9 cm.  There is however some evidence on duplex of an endoleak despite the stable appearance of the sac size.  We will check a CTA abdomen and pelvis in 1 year as opposed to a repeat duplex.  Patient will follow-up to go over the results with Dr. Myra Gianotti.  We will have the CAT scan performed at [facility] at the patient's request.  He will continue his aspirin and statin daily.  He knows to call/return office sooner with any questions or concerns.   Emilie Rutter, PA-C [facility] [phone]  Clinic MD:   Myra Gianotti

## 2023-01-20 DIAGNOSIS — S301XXA Contusion of abdominal wall, initial encounter: Secondary | ICD-10-CM | POA: Diagnosis not present

## 2023-01-20 DIAGNOSIS — I10 Essential (primary) hypertension: Secondary | ICD-10-CM | POA: Diagnosis not present

## 2023-01-27 ENCOUNTER — Telehealth: Payer: Self-pay

## 2023-01-27 NOTE — Telephone Encounter (Signed)
Pt called stating that he fell on Father's Day, went to ED, and has 5 broken ribs. A CT scan was performed and he was instructed to call this office d/t possible endoleak.  Staff msg sent to Dr. Myra Gianotti for advice.

## 2023-01-28 ENCOUNTER — Telehealth: Payer: Self-pay

## 2023-01-28 NOTE — Telephone Encounter (Signed)
Called pt, two identifiers used. Relayed Dr. Estanislado Spire response. Confirmed understanding.

## 2023-01-28 NOTE — Telephone Encounter (Signed)
-----   Message from Nada Libman, MD sent at 01/28/2023  8:44 AM EDT ----- Regarding: RE: CT Results Let him know we are aware of everything and the plan will stay the same:  f/u in April of next year with a CTA ----- Message ----- From: Hurshel Keys, RN Sent: 01/27/2023   4:55 PM EDT To: Nada Libman, MD Subject: CT Results                                     Dr. Myra Gianotti,  This pt fell on 6/16 and went to the ED ([facility]). He had lots of imaging done and the CT chest, abd, pel with contrast showed:  "Vascular: Aneurysmal dilation of the infrarenal abdominal aorta measuring up to 5.5 cm in greatest dimension status post aortobiiliac stenting. Evidence of endoleak."  Please advise. Thanks, Lawanna Kobus, RN - Triage

## 2023-05-21 DIAGNOSIS — Z23 Encounter for immunization: Secondary | ICD-10-CM | POA: Diagnosis not present

## 2023-05-28 DIAGNOSIS — X32XXXD Exposure to sunlight, subsequent encounter: Secondary | ICD-10-CM | POA: Diagnosis not present

## 2023-05-28 DIAGNOSIS — Z1283 Encounter for screening for malignant neoplasm of skin: Secondary | ICD-10-CM | POA: Diagnosis not present

## 2023-05-28 DIAGNOSIS — L821 Other seborrheic keratosis: Secondary | ICD-10-CM | POA: Diagnosis not present

## 2023-05-28 DIAGNOSIS — L570 Actinic keratosis: Secondary | ICD-10-CM | POA: Diagnosis not present

## 2023-06-05 DIAGNOSIS — E119 Type 2 diabetes mellitus without complications: Secondary | ICD-10-CM | POA: Diagnosis not present

## 2023-06-05 DIAGNOSIS — H353132 Nonexudative age-related macular degeneration, bilateral, intermediate dry stage: Secondary | ICD-10-CM | POA: Diagnosis not present

## 2023-06-05 DIAGNOSIS — H353211 Exudative age-related macular degeneration, right eye, with active choroidal neovascularization: Secondary | ICD-10-CM | POA: Diagnosis not present

## 2023-06-05 DIAGNOSIS — H35372 Puckering of macula, left eye: Secondary | ICD-10-CM | POA: Diagnosis not present

## 2023-07-09 DIAGNOSIS — X32XXXD Exposure to sunlight, subsequent encounter: Secondary | ICD-10-CM | POA: Diagnosis not present

## 2023-07-09 DIAGNOSIS — Z08 Encounter for follow-up examination after completed treatment for malignant neoplasm: Secondary | ICD-10-CM | POA: Diagnosis not present

## 2023-07-09 DIAGNOSIS — D225 Melanocytic nevi of trunk: Secondary | ICD-10-CM | POA: Diagnosis not present

## 2023-07-09 DIAGNOSIS — Z85820 Personal history of malignant melanoma of skin: Secondary | ICD-10-CM | POA: Diagnosis not present

## 2023-07-09 DIAGNOSIS — L570 Actinic keratosis: Secondary | ICD-10-CM | POA: Diagnosis not present

## 2023-07-09 DIAGNOSIS — Z1283 Encounter for screening for malignant neoplasm of skin: Secondary | ICD-10-CM | POA: Diagnosis not present

## 2023-07-10 DIAGNOSIS — H353132 Nonexudative age-related macular degeneration, bilateral, intermediate dry stage: Secondary | ICD-10-CM | POA: Diagnosis not present

## 2023-07-10 DIAGNOSIS — H353211 Exudative age-related macular degeneration, right eye, with active choroidal neovascularization: Secondary | ICD-10-CM | POA: Diagnosis not present

## 2023-07-10 DIAGNOSIS — H35372 Puckering of macula, left eye: Secondary | ICD-10-CM | POA: Diagnosis not present

## 2023-07-10 DIAGNOSIS — E119 Type 2 diabetes mellitus without complications: Secondary | ICD-10-CM | POA: Diagnosis not present

## 2023-07-28 DIAGNOSIS — I10 Essential (primary) hypertension: Secondary | ICD-10-CM | POA: Diagnosis not present

## 2023-07-28 DIAGNOSIS — E7849 Other hyperlipidemia: Secondary | ICD-10-CM | POA: Diagnosis not present

## 2023-07-28 DIAGNOSIS — E1142 Type 2 diabetes mellitus with diabetic polyneuropathy: Secondary | ICD-10-CM | POA: Diagnosis not present

## 2023-08-04 DIAGNOSIS — R42 Dizziness and giddiness: Secondary | ICD-10-CM | POA: Diagnosis not present

## 2023-08-04 DIAGNOSIS — R609 Edema, unspecified: Secondary | ICD-10-CM | POA: Diagnosis not present

## 2023-08-04 DIAGNOSIS — N400 Enlarged prostate without lower urinary tract symptoms: Secondary | ICD-10-CM | POA: Diagnosis not present

## 2023-08-04 DIAGNOSIS — E7849 Other hyperlipidemia: Secondary | ICD-10-CM | POA: Diagnosis not present

## 2023-08-04 DIAGNOSIS — I714 Abdominal aortic aneurysm, without rupture, unspecified: Secondary | ICD-10-CM | POA: Diagnosis not present

## 2023-08-04 DIAGNOSIS — Z6827 Body mass index (BMI) 27.0-27.9, adult: Secondary | ICD-10-CM | POA: Diagnosis not present

## 2023-08-04 DIAGNOSIS — I10 Essential (primary) hypertension: Secondary | ICD-10-CM | POA: Diagnosis not present

## 2023-08-04 DIAGNOSIS — E1142 Type 2 diabetes mellitus with diabetic polyneuropathy: Secondary | ICD-10-CM | POA: Diagnosis not present

## 2023-08-14 DIAGNOSIS — H353211 Exudative age-related macular degeneration, right eye, with active choroidal neovascularization: Secondary | ICD-10-CM | POA: Diagnosis not present

## 2023-08-14 DIAGNOSIS — H35372 Puckering of macula, left eye: Secondary | ICD-10-CM | POA: Diagnosis not present

## 2023-08-14 DIAGNOSIS — E119 Type 2 diabetes mellitus without complications: Secondary | ICD-10-CM | POA: Diagnosis not present

## 2023-09-15 DIAGNOSIS — H353211 Exudative age-related macular degeneration, right eye, with active choroidal neovascularization: Secondary | ICD-10-CM | POA: Diagnosis not present

## 2023-09-15 DIAGNOSIS — H35351 Cystoid macular degeneration, right eye: Secondary | ICD-10-CM | POA: Diagnosis not present

## 2023-09-15 DIAGNOSIS — Z961 Presence of intraocular lens: Secondary | ICD-10-CM | POA: Diagnosis not present

## 2023-09-15 DIAGNOSIS — E119 Type 2 diabetes mellitus without complications: Secondary | ICD-10-CM | POA: Diagnosis not present

## 2023-10-02 DIAGNOSIS — H353211 Exudative age-related macular degeneration, right eye, with active choroidal neovascularization: Secondary | ICD-10-CM | POA: Diagnosis not present

## 2023-10-02 DIAGNOSIS — H35372 Puckering of macula, left eye: Secondary | ICD-10-CM | POA: Diagnosis not present

## 2023-10-02 DIAGNOSIS — H353132 Nonexudative age-related macular degeneration, bilateral, intermediate dry stage: Secondary | ICD-10-CM | POA: Diagnosis not present

## 2023-10-02 DIAGNOSIS — E119 Type 2 diabetes mellitus without complications: Secondary | ICD-10-CM | POA: Diagnosis not present

## 2023-10-02 DIAGNOSIS — H02833 Dermatochalasis of right eye, unspecified eyelid: Secondary | ICD-10-CM | POA: Diagnosis not present

## 2023-10-15 DIAGNOSIS — K080 Exfoliation of teeth due to systemic causes: Secondary | ICD-10-CM | POA: Diagnosis not present

## 2023-11-06 ENCOUNTER — Other Ambulatory Visit: Payer: Self-pay

## 2023-11-06 DIAGNOSIS — Z9889 Other specified postprocedural states: Secondary | ICD-10-CM

## 2023-11-19 ENCOUNTER — Ambulatory Visit (HOSPITAL_COMMUNITY)
Admission: RE | Admit: 2023-11-19 | Discharge: 2023-11-19 | Disposition: A | Discharge status: Home / Self Care | Source: Ambulatory Visit | Attending: Surgery | Admitting: Surgery

## 2023-11-19 DIAGNOSIS — I70201 Unspecified atherosclerosis of native arteries of extremities, right leg: Secondary | ICD-10-CM | POA: Diagnosis not present

## 2023-11-19 DIAGNOSIS — Z9889 Other specified postprocedural states: Secondary | ICD-10-CM | POA: Diagnosis not present

## 2023-11-19 DIAGNOSIS — K8020 Calculus of gallbladder without cholecystitis without obstruction: Secondary | ICD-10-CM | POA: Diagnosis not present

## 2023-11-19 DIAGNOSIS — I714 Abdominal aortic aneurysm, without rupture, unspecified: Secondary | ICD-10-CM | POA: Diagnosis not present

## 2023-11-19 DIAGNOSIS — N281 Cyst of kidney, acquired: Secondary | ICD-10-CM | POA: Diagnosis not present

## 2023-11-19 LAB — POCT I-STAT CREATININE: Creatinine, Ser: 1.1 mg/dL (ref 0.61–1.24)

## 2023-11-19 MED ORDER — IOHEXOL 300 MG/ML  SOLN
100.0000 mL | Freq: Once | INTRAMUSCULAR | Status: AC | PRN
  Administered 2023-11-19: 100 mL via INTRAVENOUS

## 2023-11-20 DIAGNOSIS — H35372 Puckering of macula, left eye: Secondary | ICD-10-CM | POA: Diagnosis not present

## 2023-11-20 DIAGNOSIS — H353211 Exudative age-related macular degeneration, right eye, with active choroidal neovascularization: Secondary | ICD-10-CM | POA: Diagnosis not present

## 2023-11-20 DIAGNOSIS — E119 Type 2 diabetes mellitus without complications: Secondary | ICD-10-CM | POA: Diagnosis not present

## 2023-11-26 DIAGNOSIS — E1142 Type 2 diabetes mellitus with diabetic polyneuropathy: Secondary | ICD-10-CM | POA: Diagnosis not present

## 2023-11-26 DIAGNOSIS — R42 Dizziness and giddiness: Secondary | ICD-10-CM | POA: Diagnosis not present

## 2023-11-26 DIAGNOSIS — E7849 Other hyperlipidemia: Secondary | ICD-10-CM | POA: Diagnosis not present

## 2023-11-27 DIAGNOSIS — L570 Actinic keratosis: Secondary | ICD-10-CM | POA: Diagnosis not present

## 2023-11-27 DIAGNOSIS — X32XXXD Exposure to sunlight, subsequent encounter: Secondary | ICD-10-CM | POA: Diagnosis not present

## 2023-12-03 DIAGNOSIS — N400 Enlarged prostate without lower urinary tract symptoms: Secondary | ICD-10-CM | POA: Diagnosis not present

## 2023-12-03 DIAGNOSIS — I10 Essential (primary) hypertension: Secondary | ICD-10-CM | POA: Diagnosis not present

## 2023-12-03 DIAGNOSIS — E1142 Type 2 diabetes mellitus with diabetic polyneuropathy: Secondary | ICD-10-CM | POA: Diagnosis not present

## 2023-12-03 DIAGNOSIS — Z6827 Body mass index (BMI) 27.0-27.9, adult: Secondary | ICD-10-CM | POA: Diagnosis not present

## 2023-12-03 DIAGNOSIS — E782 Mixed hyperlipidemia: Secondary | ICD-10-CM | POA: Diagnosis not present

## 2023-12-03 DIAGNOSIS — E7849 Other hyperlipidemia: Secondary | ICD-10-CM | POA: Diagnosis not present

## 2023-12-04 DIAGNOSIS — E119 Type 2 diabetes mellitus without complications: Secondary | ICD-10-CM | POA: Diagnosis not present

## 2023-12-08 ENCOUNTER — Encounter: Payer: Self-pay | Admitting: Surgery

## 2023-12-08 ENCOUNTER — Ambulatory Visit: Payer: Medicare Other | Attending: Surgery | Admitting: Surgery

## 2023-12-08 VITALS — BP 139/59 | HR 48 | Temp 97.9°F | Resp 20 | Ht 70.5 in | Wt 177.0 lb

## 2023-12-08 DIAGNOSIS — I7143 Infrarenal abdominal aortic aneurysm, without rupture: Secondary | ICD-10-CM

## 2023-12-08 NOTE — Progress Notes (Signed)
 [facility]  Patient name: Danny Molina MRN: [id number] DOB: [birth year] Sex: male   REASON FOR VISIT:    Follow-up  HISOTRY OF PRESENT ILLNESS:    Danny Molina is a 89 y.o. male who is status post urgent endovascular aneurysm repair on 09/21/2009 for symptomatic aneurysm.  Ultrasound from 2024 shows a 4.9 cm aneurysm.  There was a possible endoleak and so he was sent for a CT scan for this year's follow-up.   He has no complaints today. He denies any claudication symptoms.  His walking is limited by his left ankle.  He denies any postprandial abdominal pain.  He does get occasional bloating but this is felt to be secondary to his diabetes.   PAST MEDICAL HISTORY:   Past Medical History:  Diagnosis Date   Arthritis    BCC (basal cell carcinoma of skin) 05/20/2005   LEFT POST SHOULDER- TX CURET X 3,5FU   BCC (basal cell carcinoma of skin) 12/02/2011   NOSE-TX MOHS   BCC (basal cell carcinoma of skin) 07/15/2016   SUPERFICIAL- RIGHT UPPER BACK- TX CURET AFTER BIOPSY   BCC (basal cell carcinoma of skin) 07/15/2016   SUPERFICIAL/ULCERATED RIGHT FOREARM- TX CURET AFRER BIOPSY   BCC (basal cell carcinoma of skin) 07/06/2018   SUPERFICIAL,NODULAR, INFLITRATIVE- LEFT NOSE- TX MOHS   Diabetes mellitus    Fall down stairs 07/2013   At a friends home   GERD (gastroesophageal reflux disease)    Hypertension    Malignant melanoma of left lower leg (HCC) 11/01/2015   outer left calf. tx excision   SCC (squamous cell carcinoma) 01/19/2018   RIGHT SIDEBURN- TX CURET X 3,EXCISION, 5FU   SCC (squamous cell carcinoma) 01/19/2018   WELL DIFF-LEFT SUP CROWN SCALP- TX CURET AFTER BIOPSY   SCC (squamous cell carcinoma) 11/27/2020   SCC (squamous cell carcinoma) 11/27/2020   mid parietal scalp ant (CX35FU)   SCC (squamous cell carcinoma) 11/27/2020   mid parietal scalp post (CX35FU)   SCCA (squamous cell carcinoma) of  skin 11/22/2010   RIGHT SCALP POSTERIOR- TX CURET AFTER BIOPSY   SCCA (squamous cell carcinoma) of skin 11/22/2010   RIGHT SCALP MIDDLE- TX CURET AFTER BIOPSY   SCCA (squamous cell carcinoma) of skin 11/22/2010   RIGHT SCALP ANTERIOR-TX CURET AFTER BIOPSY   SCCA (squamous cell carcinoma) of skin 03/07/2011   RIGHT CROWN- TX CURET AFTER BIOPSY   SCCA (squamous cell carcinoma) of skin 10/12/2012   RIGHT CROWN POST- TX CURET AFTER BIOPSY   SCCA (squamous cell carcinoma) of skin 11/19/2012   CROWN- TX CURET AFTER BIOPSY   SCCA (squamous cell carcinoma) of skin 12/15/2013   RIGHT FOREHEAD- TX CURET AFTER BIOPSY   SCCA (squamous cell carcinoma) of skin 05/23/2015   LEFT SCALP- TX CURET AFTER BIOPSY   SCCA (squamous cell carcinoma) of skin 05/23/2015   CENTER SCALP- TX CURET AFTER BIOPSY   SCCA (squamous cell carcinoma) of skin 05/23/2015   MID CROWN SCALP- TX CURET AFTER BIOPSY   SCCA (squamous cell carcinoma) of skin 05/23/2015   RIGHT CROWN MEDIAL- TX CURET AFTER BIOPSY   SCCA (squamous cell carcinoma) of skin 05/23/2015   RIGHT CROWN LATERAL- TX CURET AFTER BIOPSY   SCCA (squamous cell carcinoma) of skin 01/19/2018   RIGHT CROWN SUPERIOR- TX CURET AFTER BIOPSY   SCCA (squamous cell carcinoma) of skin 01/19/2018   RIGHT CROWN INFERIOR- TX CURET AFTER BIOPSY   SCCA (squamous cell carcinoma) of skin 07/06/2018  RIGHT FRONT SCALP INFERIOR- TX CURET AFTER BIOPSY   SCCA (squamous cell carcinoma) of skin 07/06/2018   RIGHT FRONT SCALP, SUPERIOR- TX CURET AFTER BIOPSY   SCCA (squamous cell carcinoma) of skin 07/06/2018   RIGHT MEDIAL SCALP- TX CURET AFTER BIOPSY   SCCA (squamous cell carcinoma) of skin 07/06/2018   RIGHT POST SCALP- CURET AFTER BIOPSY   SCCA (squamous cell carcinoma) of skin 08/27/2018   RIGHT CHEEK- TX CURET AFTER BIOPSY     FAMILY HISTORY:   Family History  Problem Relation Age of Onset   Heart disease Mother        Heart Disease before age 12   Hypertension  Mother    Heart attack Mother    Cancer Father    Heart disease Father    Heart attack Father    Hypertension Sister    Diabetes Sister    Diabetes Brother    Heart disease Brother    Hypertension Brother    Heart attack Brother    Diabetes Brother     SOCIAL HISTORY:   Social History   Tobacco Use   Smoking status: Former    Current packs/day: 0.00    Types: Cigarettes    Quit date: 01/02/1993    Years since quitting: 30.9   Smokeless tobacco: Never  Substance Use Topics   Alcohol use: Yes    Alcohol/week: 1.0 standard drink of alcohol    Types: 1 Shots of liquor per week     ALLERGIES:   Allergies  Allergen Reactions   Penicillins Anaphylaxis and Rash   Diclofenac Other (See Comments)     CURRENT MEDICATIONS:   Current Outpatient Medications  Medication Sig Dispense Refill   acetaminophen (TYLENOL) 500 MG tablet Take 500 mg by mouth every 6 (six) hours as needed for moderate pain.     amLODipine (NORVASC) 10 MG tablet Take 5 mg by mouth daily.     aspirin 81 MG tablet Take 81 mg by mouth daily.     atorvastatin (LIPITOR) 40 MG tablet Take 20 mg by mouth daily.     augmented betamethasone dipropionate (DIPROLENE-AF) 0.05 % ointment APPLY TO AFFECTED AREA TWICE A DAY AS NEEDED (NOT FACE, GROIN, UNDER ARMS)     chlorpheniramine (CHLOR-TRIMETON) 4 MG tablet Take 4 mg by mouth 2 (two) times daily as needed.     doxazosin (CARDURA) 8 MG tablet Take 4 mg by mouth at bedtime. 1/2 tab qd     fish oil-omega-3 fatty acids 1000 MG capsule Take 1,200 g by mouth 2 (two) times daily.      fluticasone (FLONASE) 50 MCG/ACT nasal spray Place 1 spray into both nostrils daily.     furosemide (LASIX) 20 MG tablet Take 20 mg by mouth daily. Reported on 09/04/2015     gabapentin (NEURONTIN) 300 MG capsule Take 600 mg by mouth at bedtime.     Halcinonide  (HALOG ) 0.1 % CREA Apply 1 application topically every morning. 60 g 0   losartan (COZAAR) 50 MG tablet Take 100 mg by mouth daily.      meclizine  (ANTIVERT ) 25 MG tablet Take 1 tablet (25 mg total) by mouth 3 (three) times daily as needed for dizziness. 30 tablet 0   metFORMIN (GLUCOPHAGE) 500 MG tablet Take 500 mg by mouth daily with breakfast.     metoprolol (TOPROL-XL) 200 MG 24 hr tablet Take 100 mg by mouth daily.      montelukast (SINGULAIR) 10 MG tablet Take 10 mg by mouth  daily.     Multiple Vitamin (MULTIVITAMIN) tablet Take 1 tablet by mouth daily.     omeprazole (PRILOSEC) 20 MG capsule Take 20 mg by mouth daily.     ondansetron  (ZOFRAN -ODT) 4 MG disintegrating tablet Take 1 tablet (4 mg total) by mouth every 8 (eight) hours as needed for nausea. 10 tablet 0   No current facility-administered medications for this visit.    REVIEW OF SYSTEMS:   [X]  denotes positive finding, [ ]  denotes negative finding Cardiac  Comments:  Chest pain or chest pressure:    Shortness of breath upon exertion:    Short of breath when lying flat:    Irregular heart rhythm:        Vascular    Pain in calf, thigh, or hip brought on by ambulation:    Pain in feet at night that wakes you up from your sleep:     Blood clot in your veins:    Leg swelling:         Pulmonary    Oxygen at home:    Productive cough:     Wheezing:         Neurologic    Sudden weakness in arms or legs:     Sudden numbness in arms or legs:     Sudden onset of difficulty speaking or slurred speech:    Temporary loss of vision in one eye:     Problems with dizziness:         Gastrointestinal    Blood in stool:     Vomited blood:         Genitourinary    Burning when urinating:     Blood in urine:        Psychiatric    Major depression:         Hematologic    Bleeding problems:    Problems with blood clotting too easily:        Skin    Rashes or ulcers:        Constitutional    Fever or chills:      PHYSICAL EXAM:   Vitals:   12/08/23 1432  BP: (!) 139/59  Pulse: (!) 48  Resp: 20  Temp: 97.9 F (36.6 C)  SpO2: 94%   Weight: 177 lb (80.3 kg)  Height: 5' 10.5" (1.791 m)    GENERAL: The patient is a well-nourished male, in no acute distress. The vital signs are documented above. CARDIAC: There is a regular rate and rhythm.  VASCULAR: I could not palpate pedal pulses PULMONARY: Non-labored respirations ABDOMEN: Soft and non-tender  MUSCULOSKELETAL: There are no major deformities or cyanosis. NEUROLOGIC: No focal weakness or paresthesias are detected. SKIN: There are no ulcers or rashes noted. PSYCHIATRIC: The patient has a normal affect.  STUDIES:   I have reviewed the following CTA: 1. Abdominal aortic aneurysm is stable in size, estimated at approximately 5.1 cm. There is no evidence of endoleak. 2. Severe stenosis of the proximal SMA. 3. Severe stenosis of the right common femoral artery.  MEDICAL ISSUES:   AAA: Aneurysm remains excluded.  Maximum diameter is stable at 5.1 cm with no evidence of endoleak.  He will follow-up in 1 year with abdominal ultrasound.  Severe stenosis of the proximal SMA and right common femoral artery were commented on on his CT scan.  The patient is asymptomatic on both accounts.  No intervention would be recommended unless he develops symptoms    Gareld June, IV, MD, FACS  [facility] [phone] Pager [phone]

## 2024-01-04 DIAGNOSIS — E119 Type 2 diabetes mellitus without complications: Secondary | ICD-10-CM | POA: Diagnosis not present

## 2024-01-15 DIAGNOSIS — H353132 Nonexudative age-related macular degeneration, bilateral, intermediate dry stage: Secondary | ICD-10-CM | POA: Diagnosis not present

## 2024-01-15 DIAGNOSIS — H02833 Dermatochalasis of right eye, unspecified eyelid: Secondary | ICD-10-CM | POA: Diagnosis not present

## 2024-01-15 DIAGNOSIS — E119 Type 2 diabetes mellitus without complications: Secondary | ICD-10-CM | POA: Diagnosis not present

## 2024-01-15 DIAGNOSIS — H353211 Exudative age-related macular degeneration, right eye, with active choroidal neovascularization: Secondary | ICD-10-CM | POA: Diagnosis not present

## 2024-01-15 DIAGNOSIS — H35372 Puckering of macula, left eye: Secondary | ICD-10-CM | POA: Diagnosis not present

## 2024-02-03 DIAGNOSIS — E119 Type 2 diabetes mellitus without complications: Secondary | ICD-10-CM | POA: Diagnosis not present

## 2024-03-05 DIAGNOSIS — E119 Type 2 diabetes mellitus without complications: Secondary | ICD-10-CM | POA: Diagnosis not present

## 2024-03-16 DIAGNOSIS — H353132 Nonexudative age-related macular degeneration, bilateral, intermediate dry stage: Secondary | ICD-10-CM | POA: Diagnosis not present

## 2024-03-16 DIAGNOSIS — E119 Type 2 diabetes mellitus without complications: Secondary | ICD-10-CM | POA: Diagnosis not present

## 2024-03-16 DIAGNOSIS — H02833 Dermatochalasis of right eye, unspecified eyelid: Secondary | ICD-10-CM | POA: Diagnosis not present

## 2024-03-16 DIAGNOSIS — H35372 Puckering of macula, left eye: Secondary | ICD-10-CM | POA: Diagnosis not present

## 2024-03-16 DIAGNOSIS — H353211 Exudative age-related macular degeneration, right eye, with active choroidal neovascularization: Secondary | ICD-10-CM | POA: Diagnosis not present

## 2024-03-31 DIAGNOSIS — Z6826 Body mass index (BMI) 26.0-26.9, adult: Secondary | ICD-10-CM | POA: Diagnosis not present

## 2024-03-31 DIAGNOSIS — E7849 Other hyperlipidemia: Secondary | ICD-10-CM | POA: Diagnosis not present

## 2024-03-31 DIAGNOSIS — E1142 Type 2 diabetes mellitus with diabetic polyneuropathy: Secondary | ICD-10-CM | POA: Diagnosis not present

## 2024-03-31 DIAGNOSIS — Z1389 Encounter for screening for other disorder: Secondary | ICD-10-CM | POA: Diagnosis not present

## 2024-03-31 DIAGNOSIS — Z9189 Other specified personal risk factors, not elsewhere classified: Secondary | ICD-10-CM | POA: Diagnosis not present

## 2024-03-31 DIAGNOSIS — I10 Essential (primary) hypertension: Secondary | ICD-10-CM | POA: Diagnosis not present

## 2024-03-31 DIAGNOSIS — Z0001 Encounter for general adult medical examination with abnormal findings: Secondary | ICD-10-CM | POA: Diagnosis not present

## 2024-03-31 DIAGNOSIS — K219 Gastro-esophageal reflux disease without esophagitis: Secondary | ICD-10-CM | POA: Diagnosis not present

## 2024-03-31 DIAGNOSIS — E782 Mixed hyperlipidemia: Secondary | ICD-10-CM | POA: Diagnosis not present

## 2024-03-31 DIAGNOSIS — Z1331 Encounter for screening for depression: Secondary | ICD-10-CM | POA: Diagnosis not present

## 2024-04-05 DIAGNOSIS — E119 Type 2 diabetes mellitus without complications: Secondary | ICD-10-CM | POA: Diagnosis not present

## 2024-04-12 DIAGNOSIS — B078 Other viral warts: Secondary | ICD-10-CM | POA: Diagnosis not present

## 2024-04-12 DIAGNOSIS — X32XXXD Exposure to sunlight, subsequent encounter: Secondary | ICD-10-CM | POA: Diagnosis not present

## 2024-04-12 DIAGNOSIS — L570 Actinic keratosis: Secondary | ICD-10-CM | POA: Diagnosis not present

## 2024-04-28 DIAGNOSIS — G8929 Other chronic pain: Secondary | ICD-10-CM | POA: Diagnosis not present

## 2024-05-05 DIAGNOSIS — E119 Type 2 diabetes mellitus without complications: Secondary | ICD-10-CM | POA: Diagnosis not present

## 2024-05-11 DIAGNOSIS — H353211 Exudative age-related macular degeneration, right eye, with active choroidal neovascularization: Secondary | ICD-10-CM | POA: Diagnosis not present

## 2024-05-11 DIAGNOSIS — H02833 Dermatochalasis of right eye, unspecified eyelid: Secondary | ICD-10-CM | POA: Diagnosis not present

## 2024-05-11 DIAGNOSIS — H353132 Nonexudative age-related macular degeneration, bilateral, intermediate dry stage: Secondary | ICD-10-CM | POA: Diagnosis not present

## 2024-05-11 DIAGNOSIS — E119 Type 2 diabetes mellitus without complications: Secondary | ICD-10-CM | POA: Diagnosis not present

## 2024-05-11 DIAGNOSIS — H35372 Puckering of macula, left eye: Secondary | ICD-10-CM | POA: Diagnosis not present

## 2024-05-19 DIAGNOSIS — J302 Other seasonal allergic rhinitis: Secondary | ICD-10-CM | POA: Diagnosis not present

## 2024-05-19 DIAGNOSIS — Z6826 Body mass index (BMI) 26.0-26.9, adult: Secondary | ICD-10-CM | POA: Diagnosis not present

## 2024-05-19 DIAGNOSIS — G629 Polyneuropathy, unspecified: Secondary | ICD-10-CM | POA: Diagnosis not present

## 2024-05-21 DIAGNOSIS — C4372 Malignant melanoma of left lower limb, including hip: Secondary | ICD-10-CM | POA: Diagnosis not present

## 2024-06-05 DIAGNOSIS — E119 Type 2 diabetes mellitus without complications: Secondary | ICD-10-CM | POA: Diagnosis not present

## 2024-07-13 DIAGNOSIS — H35372 Puckering of macula, left eye: Secondary | ICD-10-CM | POA: Diagnosis not present

## 2024-07-13 DIAGNOSIS — H353211 Exudative age-related macular degeneration, right eye, with active choroidal neovascularization: Secondary | ICD-10-CM | POA: Diagnosis not present

## 2024-07-13 DIAGNOSIS — E119 Type 2 diabetes mellitus without complications: Secondary | ICD-10-CM | POA: Diagnosis not present

## 2024-07-13 DIAGNOSIS — H353132 Nonexudative age-related macular degeneration, bilateral, intermediate dry stage: Secondary | ICD-10-CM | POA: Diagnosis not present

## 2024-07-13 DIAGNOSIS — H02833 Dermatochalasis of right eye, unspecified eyelid: Secondary | ICD-10-CM | POA: Diagnosis not present
